# Patient Record
Sex: Female | Born: 1940 | Race: White | Hispanic: No | Marital: Married | Smoking: Former smoker
Health system: Southern US, Community
[De-identification: ages and names within clinical notes are randomized; demographics above are authoritative.]

## PROBLEM LIST (undated history)

## (undated) DIAGNOSIS — T8859XA Other complications of anesthesia, initial encounter: Secondary | ICD-10-CM

## (undated) DIAGNOSIS — F419 Anxiety disorder, unspecified: Secondary | ICD-10-CM

## (undated) DIAGNOSIS — C801 Malignant (primary) neoplasm, unspecified: Secondary | ICD-10-CM

## (undated) DIAGNOSIS — K219 Gastro-esophageal reflux disease without esophagitis: Secondary | ICD-10-CM

## (undated) DIAGNOSIS — Z9889 Other specified postprocedural states: Secondary | ICD-10-CM

## (undated) DIAGNOSIS — Z8719 Personal history of other diseases of the digestive system: Secondary | ICD-10-CM

## (undated) DIAGNOSIS — Z780 Asymptomatic menopausal state: Secondary | ICD-10-CM

## (undated) DIAGNOSIS — E785 Hyperlipidemia, unspecified: Secondary | ICD-10-CM

## (undated) DIAGNOSIS — M1990 Unspecified osteoarthritis, unspecified site: Secondary | ICD-10-CM

## (undated) DIAGNOSIS — N390 Urinary tract infection, site not specified: Secondary | ICD-10-CM

## (undated) DIAGNOSIS — M419 Scoliosis, unspecified: Secondary | ICD-10-CM

## (undated) DIAGNOSIS — H9190 Unspecified hearing loss, unspecified ear: Secondary | ICD-10-CM

## (undated) DIAGNOSIS — R55 Syncope and collapse: Secondary | ICD-10-CM

## (undated) DIAGNOSIS — M8580 Other specified disorders of bone density and structure, unspecified site: Secondary | ICD-10-CM

## (undated) DIAGNOSIS — R112 Nausea with vomiting, unspecified: Secondary | ICD-10-CM

## (undated) DIAGNOSIS — M549 Dorsalgia, unspecified: Secondary | ICD-10-CM

## (undated) DIAGNOSIS — G4730 Sleep apnea, unspecified: Secondary | ICD-10-CM

## (undated) DIAGNOSIS — G4700 Insomnia, unspecified: Secondary | ICD-10-CM

## (undated) DIAGNOSIS — T4145XA Adverse effect of unspecified anesthetic, initial encounter: Secondary | ICD-10-CM

## (undated) HISTORY — DX: Insomnia, unspecified: G47.00

## (undated) HISTORY — DX: Other specified disorders of bone density and structure, unspecified site: M85.80

## (undated) HISTORY — PX: HIATAL HERNIA REPAIR: SHX195

## (undated) HISTORY — PX: BLADDER SUSPENSION: SHX72

## (undated) HISTORY — DX: Syncope and collapse: R55

## (undated) HISTORY — PX: CHOLECYSTECTOMY: SHX55

## (undated) HISTORY — DX: Asymptomatic menopausal state: Z78.0

## (undated) HISTORY — DX: Dorsalgia, unspecified: M54.9

## (undated) HISTORY — PX: TONSILLECTOMY: SUR1361

## (undated) HISTORY — DX: Urinary tract infection, site not specified: N39.0

## (undated) HISTORY — PX: EYE SURGERY: SHX253

## (undated) HISTORY — DX: Unspecified osteoarthritis, unspecified site: M19.90

## (undated) HISTORY — PX: ABDOMINAL HYSTERECTOMY: SHX81

## (undated) HISTORY — DX: Hyperlipidemia, unspecified: E78.5

## (undated) HISTORY — DX: Scoliosis, unspecified: M41.9

---

## 1998-04-13 ENCOUNTER — Other Ambulatory Visit: Admission: RE | Admit: 1998-04-13 | Discharge: 1998-04-13 | Payer: Self-pay | Admitting: Obstetrics and Gynecology

## 2004-09-03 ENCOUNTER — Ambulatory Visit: Payer: Self-pay | Admitting: Internal Medicine

## 2004-09-17 ENCOUNTER — Ambulatory Visit: Payer: Self-pay | Admitting: Internal Medicine

## 2004-09-21 ENCOUNTER — Emergency Department: Payer: Self-pay | Admitting: Emergency Medicine

## 2004-09-21 ENCOUNTER — Other Ambulatory Visit: Payer: Self-pay

## 2004-11-15 ENCOUNTER — Ambulatory Visit: Payer: Self-pay | Admitting: Internal Medicine

## 2005-12-29 ENCOUNTER — Ambulatory Visit: Payer: Self-pay | Admitting: Internal Medicine

## 2006-03-23 ENCOUNTER — Inpatient Hospital Stay (HOSPITAL_COMMUNITY): Admission: RE | Admit: 2006-03-23 | Discharge: 2006-03-24 | Payer: Self-pay | Admitting: Obstetrics and Gynecology

## 2007-01-02 ENCOUNTER — Ambulatory Visit: Payer: Self-pay | Admitting: Internal Medicine

## 2007-11-22 ENCOUNTER — Ambulatory Visit: Payer: Self-pay | Admitting: Internal Medicine

## 2008-01-15 ENCOUNTER — Ambulatory Visit: Payer: Self-pay | Admitting: Internal Medicine

## 2009-01-16 ENCOUNTER — Ambulatory Visit: Payer: Self-pay | Admitting: Internal Medicine

## 2009-06-30 IMAGING — US ABDOMEN ULTRASOUND
1 series · 17 of 25 positions shown · non-contrast
Comparison: none

REASON FOR EXAM: Epigastric Pain
COMMENTS:

[Series 1: abdomen ultrasound · 17 of 71 slices shown]
[im 1/71]
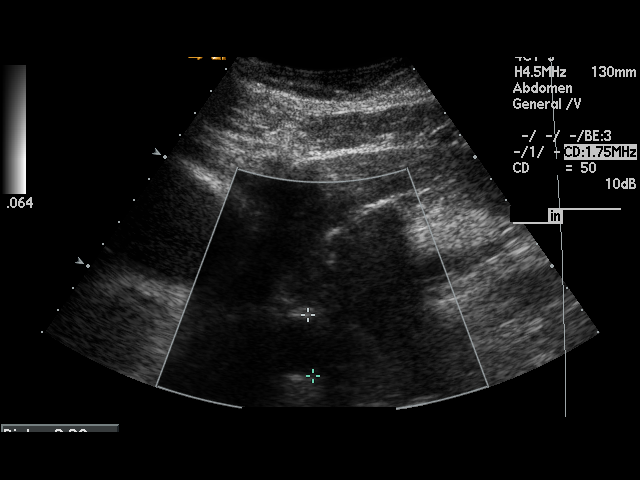
[im 6/71]
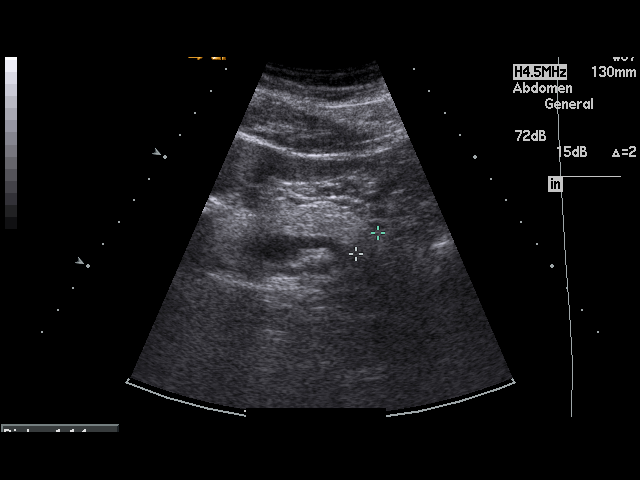
[im 9/71]
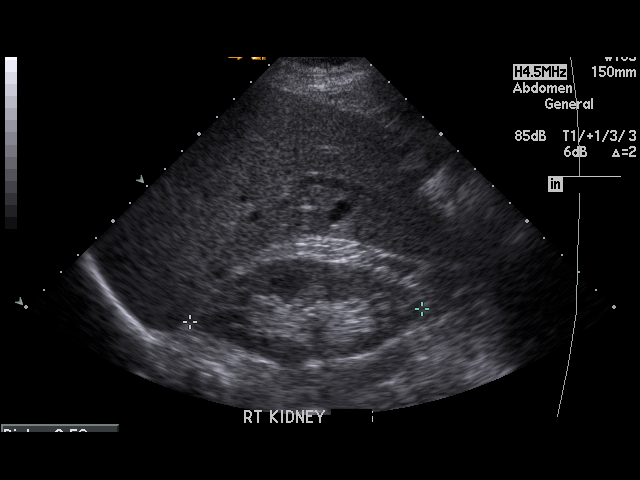
[im 15/71]
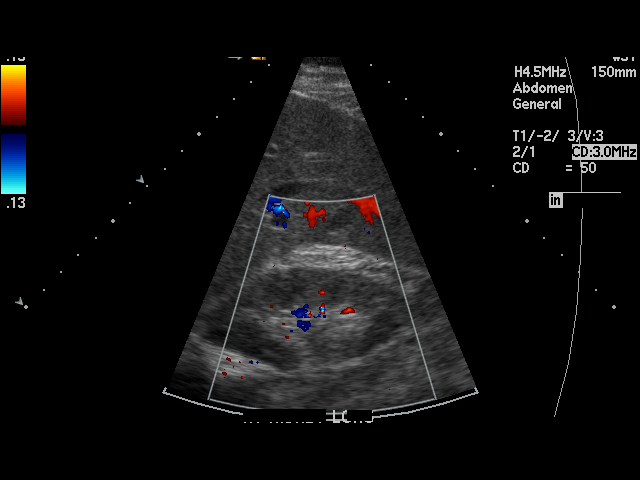
[im 18/71]
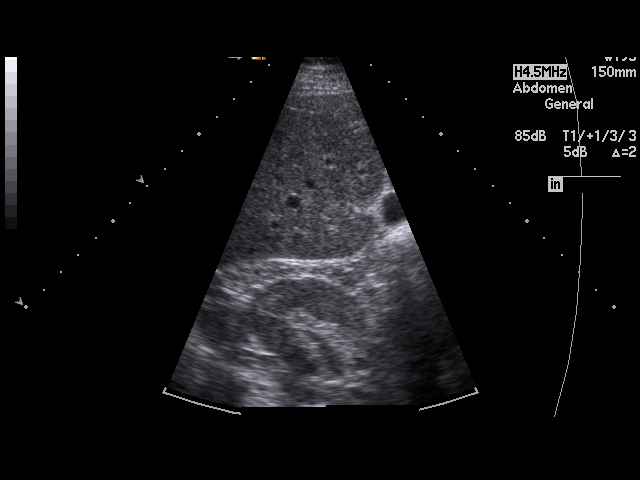
[im 24/71]
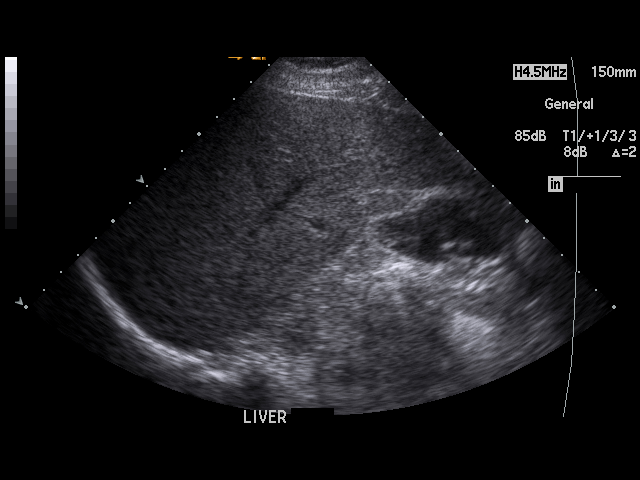
[im 27/71]
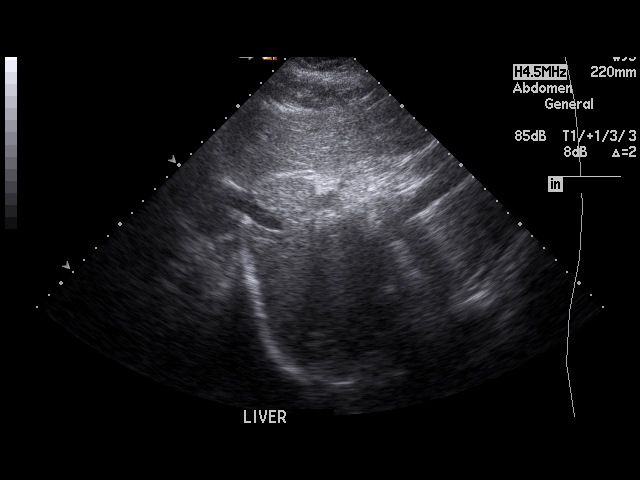
[im 33/71]
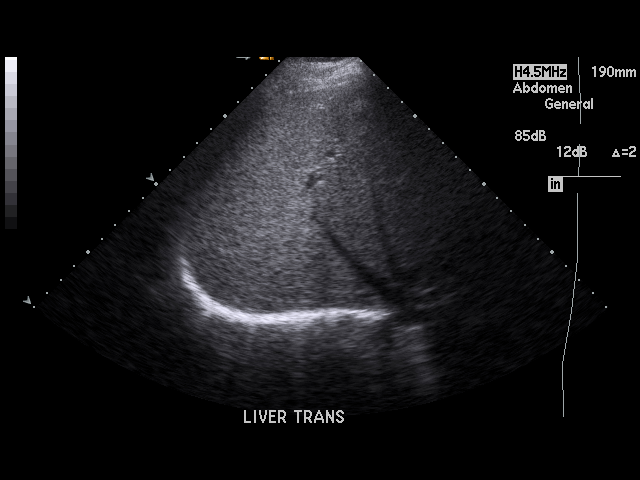
[im 36/71]
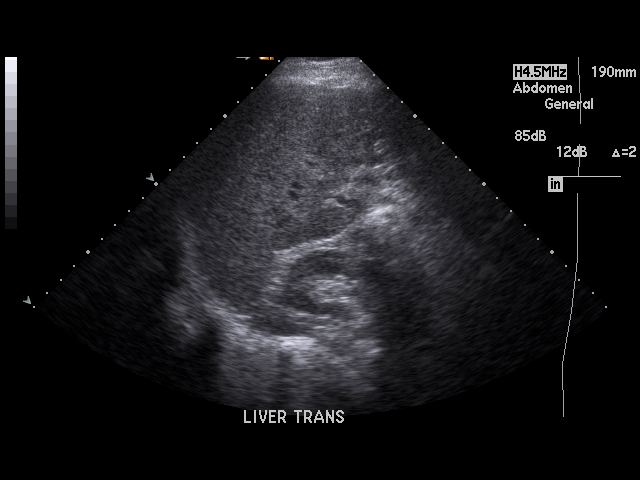
[im 38/71]
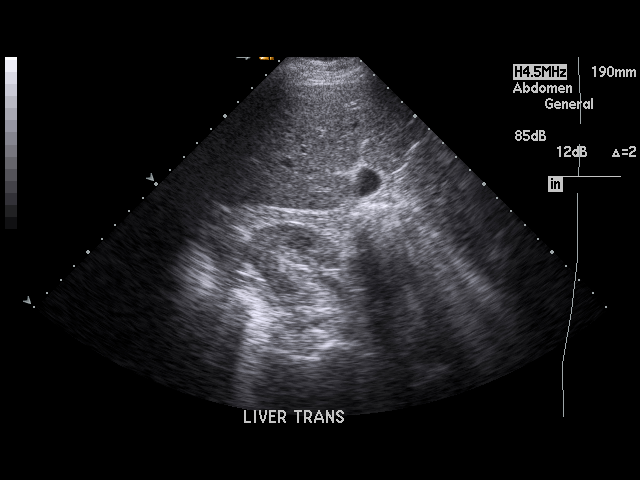
[im 44/71]
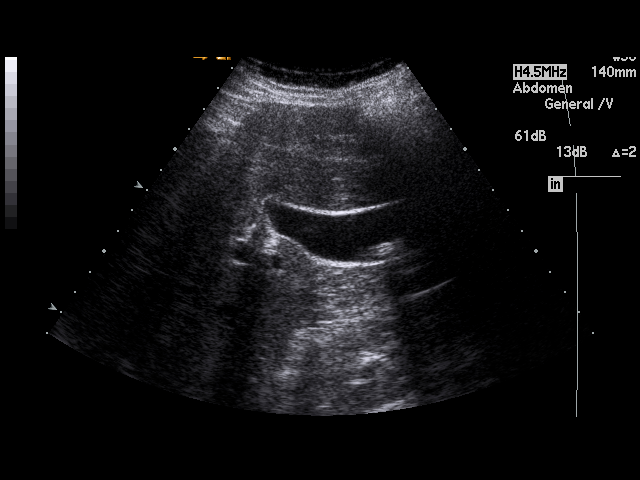
[im 47/71]
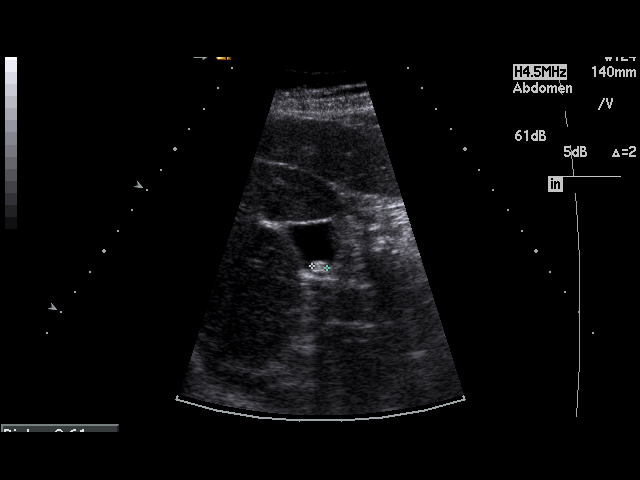
[im 53/71]
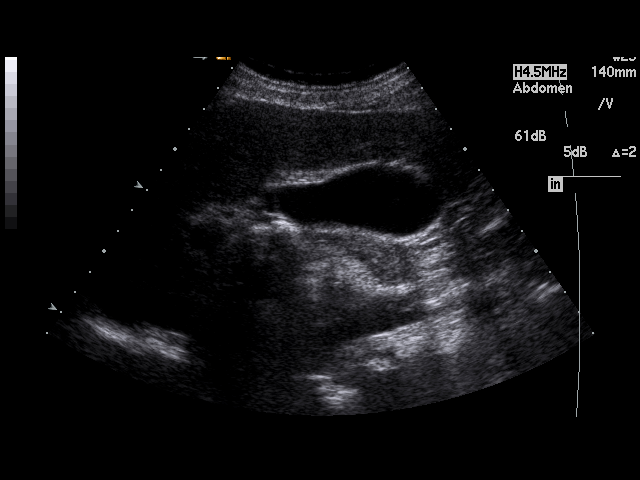
[im 56/71]
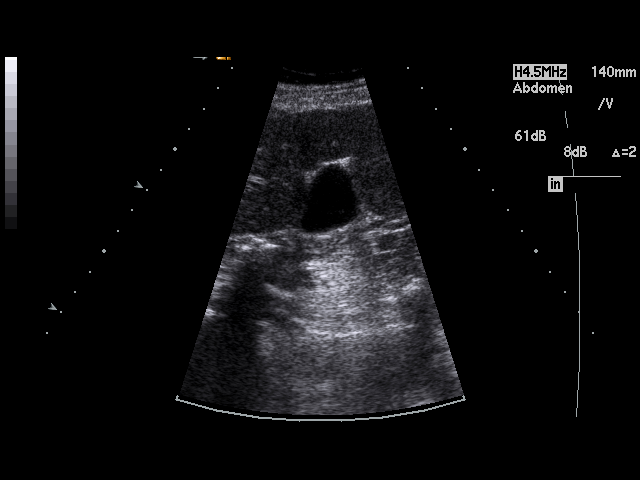
[im 62/71]
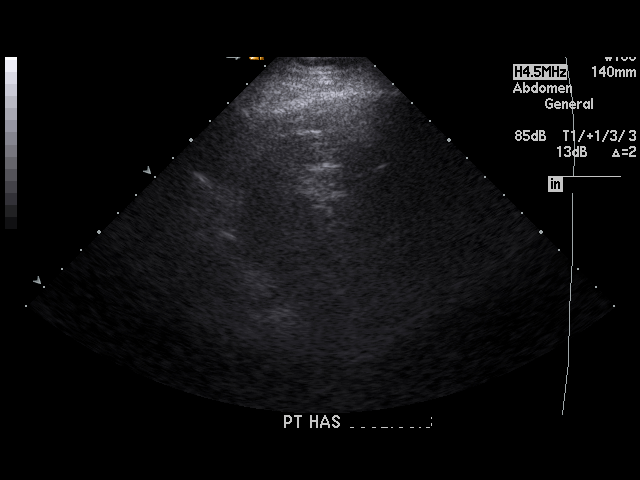
[im 65/71]
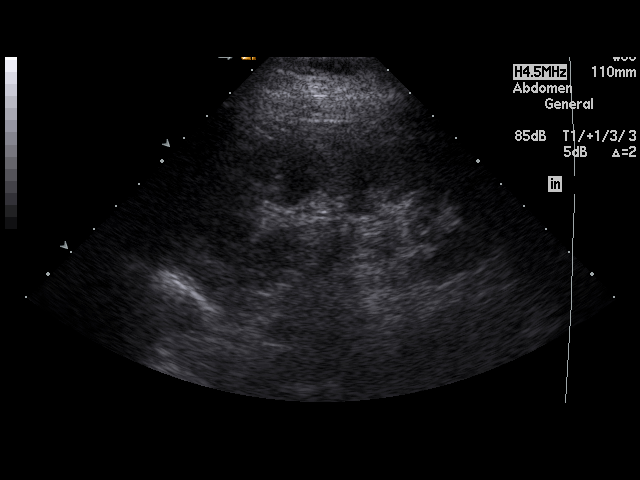
[im 71/71]
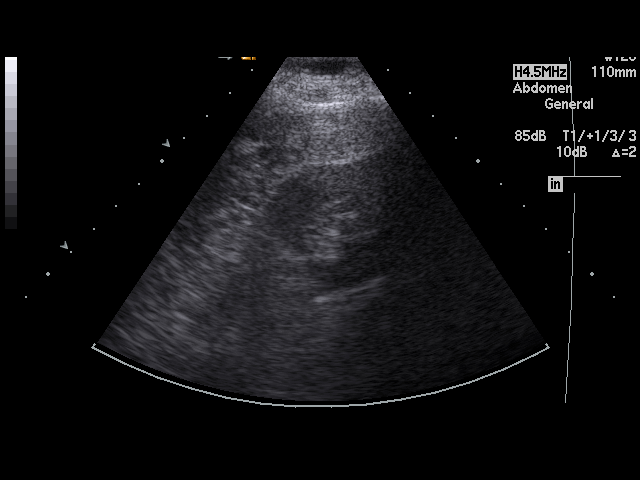

[17 of 25 positions shown; findings below may reference images not displayed]

PROCEDURE:     US  - US ABDOMEN GENERAL SURVEY  - November 22, 2007  [DATE]

RESULT:     There is a 1.23 cm cyst in the posterior aspect of the RIGHT
lobe of the liver. No other hepatic mass lesions are seen. The spleen is
within normal limits for size. The pancreas, abdominal aorta and inferior
vena cava show no significant abnormalities. There are multiple mobile
echodensities in the gallbladder consistent with gallstones. There is no
thickening of the gallbladder wall. The common bile duct measures 7.0 mm in
diameter. This is upper limits for normal in size or slightly enlarged. If
the patient has laboratory findings suggestive of common duct obstruction,
then further evaluation by biliary nuclide scan would be recommended. The
kidneys show no hydronephrosis. There is no ascites.
IMPRESSION: 1. Cholelithiasis.
2. The common bile duct is mildly prominent. The degree of prominence noted
can be normal for this age patient but the possibility of partial common
duct obstruction cannot be ruled out and therefore if there are laboratory
values suggesting common duct obstruction then further evaluation by biliary
nuclide scan would be recommended.
3. Incidental note is made of a small cyst to the RIGHT lobe of the liver.
4. No ascites is seen.

## 2009-11-13 ENCOUNTER — Ambulatory Visit: Payer: Self-pay | Admitting: Specialist

## 2010-01-26 ENCOUNTER — Ambulatory Visit: Payer: Self-pay | Admitting: Internal Medicine

## 2011-01-28 ENCOUNTER — Ambulatory Visit: Payer: Self-pay | Admitting: Internal Medicine

## 2011-09-06 ENCOUNTER — Ambulatory Visit: Payer: Self-pay | Admitting: Internal Medicine

## 2011-09-08 ENCOUNTER — Ambulatory Visit: Payer: Self-pay | Admitting: Internal Medicine

## 2011-09-09 ENCOUNTER — Ambulatory Visit: Payer: Self-pay | Admitting: Internal Medicine

## 2011-09-09 LAB — PROTIME-INR: INR: 1

## 2011-09-16 ENCOUNTER — Ambulatory Visit: Payer: Self-pay | Admitting: Internal Medicine

## 2011-09-20 ENCOUNTER — Ambulatory Visit: Payer: Self-pay | Admitting: Otolaryngology

## 2011-09-22 ENCOUNTER — Ambulatory Visit: Payer: Self-pay | Admitting: Otolaryngology

## 2011-09-26 LAB — WOUND CULTURE

## 2011-09-28 ENCOUNTER — Ambulatory Visit: Payer: Self-pay | Admitting: Internal Medicine

## 2011-09-28 LAB — CBC CANCER CENTER
Basophil %: 0.5 %
Comment - H1-Com1: NORMAL
Eosinophil %: 1.8 %
Eosinophil: 2 %
HCT: 40.1 % (ref 35.0–47.0)
Lymphocyte #: 1 x10 3/mm (ref 1.0–3.6)
Lymphocyte %: 16.6 %
Monocyte %: 7 %
Monocytes: 5 %
Neutrophil #: 4.6 x10 3/mm (ref 1.4–6.5)
Platelet: 243 x10 3/mm (ref 150–440)
RBC: 4.65 10*6/uL (ref 3.80–5.20)
RDW: 13.3 % (ref 11.5–14.5)
Segmented Neutrophils: 77 %
WBC: 6.2 x10 3/mm (ref 3.6–11.0)

## 2011-09-29 ENCOUNTER — Ambulatory Visit: Payer: Self-pay | Admitting: Internal Medicine

## 2011-10-13 LAB — CULTURE, FUNGUS WITHOUT SMEAR

## 2011-10-17 ENCOUNTER — Ambulatory Visit: Payer: Self-pay | Admitting: Internal Medicine

## 2011-11-04 LAB — PATHOLOGY REPORT

## 2011-12-09 DIAGNOSIS — C8590 Non-Hodgkin lymphoma, unspecified, unspecified site: Secondary | ICD-10-CM | POA: Insufficient documentation

## 2012-03-22 ENCOUNTER — Ambulatory Visit: Payer: Self-pay | Admitting: Internal Medicine

## 2012-06-04 DIAGNOSIS — N3020 Other chronic cystitis without hematuria: Secondary | ICD-10-CM | POA: Insufficient documentation

## 2013-03-25 ENCOUNTER — Ambulatory Visit: Payer: Self-pay | Admitting: Internal Medicine

## 2013-05-28 DIAGNOSIS — C8290 Follicular lymphoma, unspecified, unspecified site: Secondary | ICD-10-CM | POA: Insufficient documentation

## 2014-04-07 ENCOUNTER — Ambulatory Visit: Payer: Self-pay | Admitting: Internal Medicine

## 2014-06-16 DIAGNOSIS — K8020 Calculus of gallbladder without cholecystitis without obstruction: Secondary | ICD-10-CM | POA: Insufficient documentation

## 2014-06-24 ENCOUNTER — Ambulatory Visit: Payer: Self-pay | Admitting: Internal Medicine

## 2014-06-25 ENCOUNTER — Encounter: Payer: Self-pay | Admitting: *Deleted

## 2014-07-01 ENCOUNTER — Ambulatory Visit: Payer: Self-pay | Admitting: General Surgery

## 2014-07-03 ENCOUNTER — Ambulatory Visit: Payer: Self-pay | Admitting: Surgery

## 2014-11-08 NOTE — Op Note (Signed)
PATIENT NAME:  Natalie Torres, ALIG MR#:  [id number] DATE OF BIRTH:  31-Jul-1940  DATE OF PROCEDURE:  07/03/2014  PREOPERATIVE DIAGNOSIS:  Cholelithiasis and chronic cholecystitis.   POSTOPERATIVE DIAGNOSIS:  Cholelithiasis and chronic cholecystitis.   OPERATION: Robotic-assisted laparoscopic cholecystectomy.   SURGEON: Rodena Goldmann III, MD.    ANESTHESIA: General.   OPERATIVE PROCEDURE: With the patient in the supine position after induction of appropriate general anesthesia the patient's abdomen was prepped with ChloraPrep and draped with sterile towels.  The patient was placed in the head down, feet up position. A small infraumbilical incision was made in the standard fashion, carried down bluntly through subcutaneous tissue. A Veress needle was used to cannulate the peritoneal cavity. CO2 was insufflated to appropriate pressure measurements. With approximately 2 liters of CO2 were instilled the Veress needle was withdrawn and an 8.5 mm robotic port was inserted into the peritoneal cavity. Intraperitoneal position was confirmed and CO2 was re-insufflated. Two lateral ports were placed on the right side and a single one on the left side under direct vision. The patient was placed in the head up, feet down position and rotated slightly to the left side. Robot was then brought to the table, docked with the ports, the instruments inserted under direct vision. I then moved to the console. The liver, a slightly enlarged left lobe of the liver hung down over the gallbladder. The gallbladder was retracted superiorly and laterally, exposing the hepatoduodenal ligament. The cystic artery and the cystic duct were identified. The cystic artery was doubly clipped and divided first. The gallbladder was then a little easier to retract and cystic duct was visualized, cleaned, doubly clipped and divided. A small rent was made in the gallbladder, but only a small amount of bile was spilled and no stones were spilled. The  gallbladder was then dissected free from its bed with the hook and cautery apparatus. The area was copiously irrigated with a liter of warm saline solution under direct vision. Once the gallbladder was free it was grasped with the free assistant port and the robot undocked and removed from the patient. Midline port was exchanged for a 10 mm port and the EndoCatch apparatus inserted through the 10 mm port, capturing the gallbladder and removing it through the umbilical incision. The air was then desufflated. All ports withdrawn without difficulty. The midline fascia was closed with figure-of-eight sutures of 0 Vicryl. The skin was closed with 5-0 nylon. The area was infiltrated with 0.25% Marcaine for postoperative pain control. Sterile dressings were applied. The patient was returned to the recovery room, having tolerated the procedure well.  Sponge and instrument count were correct x 2 in the operating room.     ____________________________ Micheline Maze, MD rle:bu D: 07/03/2014 10:23:59 ET T: 07/03/2014 21:13:38 ET JOB#: [id number]  cc: Micheline Maze, MD, <Dictator> Leonie Douglas. Doy Hutching, MD Rodena Goldmann MD ELECTRONICALLY SIGNED 07/07/2014 19:51

## 2014-11-09 NOTE — Op Note (Signed)
PATIENT NAME:  TASHEMA, TILLER MR#:  [id number] DATE OF BIRTH:  [birth year]  DATE OF PROCEDURE:  09/22/2011  PREOPERATIVE DIAGNOSIS: Right supraclavicular lymphadenopathy.   POSTOPERATIVE DIAGNOSIS: Right supraclavicular lymphadenopathy.  PROCEDURE: Resection right deep supraclavicular lymph nodes.   SURGEON: Janalee Dane, MD   ASSISTANT: Mahlon Gammon, MD    DESCRIPTION OF PROCEDURE: The patient was placed in a supine position on the operating room table. After general endotracheal anesthesia had been induced, the patient was placed on a shoulder roll, neck extended and chin turned left. The planned incision was locally anesthetized, prepped and draped in the usual fashion. A 15 blade was used to make an incision through the skin and dissection was meticulously carried down to the large lymph node that was palpable in the right supraclavicular fossa. The lymph node was meticulously isolated and dissected from the surrounding soft tissues and sent for frozen section analysis. The frozen section showed significant necrosis without viable tissue. Therefore, after the wound had been closed, the pathologist recommended additional resection of cervical lymph nodes. Therefore, the wound was reopened and additional lymph nodes were taken from the supraclavicular fossa. The wound was then copiously irrigated and checked for meticulous hemostasis. Hemostasis was achieved and the wound was closed over a TLS drain. The patient was then returned to anesthesia, allowed to emerge from anesthesia in the operating room, and taken to the recovery room in stable condition. No complications. Estimated blood loss 10 mL.   ____________________________ J. Nadeen Landau, MD jmc:drc D: 09/22/2011 19:29:22 ET T: 09/23/2011 08:02:55 ET JOB#: [id number]  cc: Janalee Dane, MD, <Dictator> Nicholos Johns MD ELECTRONICALLY SIGNED 09/26/2011 8:08

## 2014-11-10 LAB — SURGICAL PATHOLOGY

## 2014-11-26 ENCOUNTER — Encounter
Admission: RE | Admit: 2014-11-26 | Discharge: 2014-11-26 | Disposition: A | Discharge status: Home / Self Care | Payer: Medicare Other | Source: Ambulatory Visit | Attending: Ophthalmology | Admitting: Ophthalmology

## 2014-11-26 DIAGNOSIS — Z01810 Encounter for preprocedural cardiovascular examination: Secondary | ICD-10-CM | POA: Diagnosis present

## 2014-11-26 DIAGNOSIS — H2511 Age-related nuclear cataract, right eye: Secondary | ICD-10-CM | POA: Diagnosis not present

## 2014-11-26 DIAGNOSIS — I10 Essential (primary) hypertension: Secondary | ICD-10-CM | POA: Diagnosis not present

## 2014-11-27 ENCOUNTER — Encounter: Payer: Self-pay | Admitting: *Deleted

## 2014-11-27 DIAGNOSIS — F419 Anxiety disorder, unspecified: Secondary | ICD-10-CM | POA: Diagnosis not present

## 2014-11-27 DIAGNOSIS — Z9049 Acquired absence of other specified parts of digestive tract: Secondary | ICD-10-CM | POA: Diagnosis not present

## 2014-11-27 DIAGNOSIS — M1990 Unspecified osteoarthritis, unspecified site: Secondary | ICD-10-CM | POA: Diagnosis not present

## 2014-11-27 DIAGNOSIS — Z885 Allergy status to narcotic agent status: Secondary | ICD-10-CM | POA: Diagnosis not present

## 2014-11-27 DIAGNOSIS — H9193 Unspecified hearing loss, bilateral: Secondary | ICD-10-CM | POA: Diagnosis not present

## 2014-11-27 DIAGNOSIS — G4730 Sleep apnea, unspecified: Secondary | ICD-10-CM | POA: Diagnosis not present

## 2014-11-27 DIAGNOSIS — Z8572 Personal history of non-Hodgkin lymphomas: Secondary | ICD-10-CM | POA: Diagnosis not present

## 2014-11-27 DIAGNOSIS — Z90710 Acquired absence of both cervix and uterus: Secondary | ICD-10-CM | POA: Diagnosis not present

## 2014-11-27 DIAGNOSIS — H2511 Age-related nuclear cataract, right eye: Secondary | ICD-10-CM | POA: Diagnosis present

## 2014-11-27 DIAGNOSIS — K5790 Diverticulosis of intestine, part unspecified, without perforation or abscess without bleeding: Secondary | ICD-10-CM | POA: Diagnosis not present

## 2014-11-27 DIAGNOSIS — Z79899 Other long term (current) drug therapy: Secondary | ICD-10-CM | POA: Diagnosis not present

## 2014-11-27 DIAGNOSIS — M810 Age-related osteoporosis without current pathological fracture: Secondary | ICD-10-CM | POA: Diagnosis not present

## 2014-11-27 DIAGNOSIS — E780 Pure hypercholesterolemia: Secondary | ICD-10-CM | POA: Diagnosis not present

## 2014-12-02 ENCOUNTER — Encounter: Payer: Self-pay | Admitting: *Deleted

## 2014-12-02 ENCOUNTER — Ambulatory Visit
Admission: RE | Admit: 2014-12-02 | Discharge: 2014-12-02 | Disposition: A | Discharge status: Home / Self Care | Payer: Medicare Other | Source: Ambulatory Visit | Attending: Ophthalmology | Admitting: Ophthalmology

## 2014-12-02 ENCOUNTER — Ambulatory Visit: Payer: Medicare Other | Admitting: Anesthesiology

## 2014-12-02 ENCOUNTER — Encounter
Admission: RE | Disposition: A | Discharge status: Home / Self Care | Payer: Self-pay | Source: Ambulatory Visit | Attending: Ophthalmology

## 2014-12-02 DIAGNOSIS — Z9049 Acquired absence of other specified parts of digestive tract: Secondary | ICD-10-CM | POA: Insufficient documentation

## 2014-12-02 DIAGNOSIS — F419 Anxiety disorder, unspecified: Secondary | ICD-10-CM | POA: Insufficient documentation

## 2014-12-02 DIAGNOSIS — M810 Age-related osteoporosis without current pathological fracture: Secondary | ICD-10-CM | POA: Insufficient documentation

## 2014-12-02 DIAGNOSIS — G4730 Sleep apnea, unspecified: Secondary | ICD-10-CM | POA: Insufficient documentation

## 2014-12-02 DIAGNOSIS — Z8572 Personal history of non-Hodgkin lymphomas: Secondary | ICD-10-CM | POA: Insufficient documentation

## 2014-12-02 DIAGNOSIS — M1990 Unspecified osteoarthritis, unspecified site: Secondary | ICD-10-CM | POA: Insufficient documentation

## 2014-12-02 DIAGNOSIS — Z885 Allergy status to narcotic agent status: Secondary | ICD-10-CM | POA: Insufficient documentation

## 2014-12-02 DIAGNOSIS — H2511 Age-related nuclear cataract, right eye: Secondary | ICD-10-CM | POA: Insufficient documentation

## 2014-12-02 DIAGNOSIS — H9193 Unspecified hearing loss, bilateral: Secondary | ICD-10-CM | POA: Insufficient documentation

## 2014-12-02 DIAGNOSIS — E780 Pure hypercholesterolemia: Secondary | ICD-10-CM | POA: Insufficient documentation

## 2014-12-02 DIAGNOSIS — Z90710 Acquired absence of both cervix and uterus: Secondary | ICD-10-CM | POA: Insufficient documentation

## 2014-12-02 DIAGNOSIS — Z79899 Other long term (current) drug therapy: Secondary | ICD-10-CM | POA: Insufficient documentation

## 2014-12-02 DIAGNOSIS — K5790 Diverticulosis of intestine, part unspecified, without perforation or abscess without bleeding: Secondary | ICD-10-CM | POA: Insufficient documentation

## 2014-12-02 HISTORY — DX: Unspecified hearing loss, unspecified ear: H91.90

## 2014-12-02 HISTORY — DX: Anxiety disorder, unspecified: F41.9

## 2014-12-02 HISTORY — DX: Unspecified osteoarthritis, unspecified site: M19.90

## 2014-12-02 HISTORY — DX: Adverse effect of unspecified anesthetic, initial encounter: T41.45XA

## 2014-12-02 HISTORY — DX: Sleep apnea, unspecified: G47.30

## 2014-12-02 HISTORY — DX: Malignant (primary) neoplasm, unspecified: C80.1

## 2014-12-02 HISTORY — DX: Other complications of anesthesia, initial encounter: T88.59XA

## 2014-12-02 HISTORY — PX: CATARACT EXTRACTION W/PHACO: SHX586

## 2014-12-02 SURGERY — PHACOEMULSIFICATION, CATARACT, WITH IOL INSERTION
Anesthesia: Monitor Anesthesia Care | Laterality: Right

## 2014-12-02 MED ORDER — EPINEPHRINE HCL 1 MG/ML IJ SOLN
INTRAOCULAR | Status: DC | PRN
  Administered 2014-12-02: 200 mL

## 2014-12-02 MED ORDER — MOXIFLOXACIN HCL 0.5 % OP SOLN - NO CHARGE
OPHTHALMIC | Status: DC | PRN
Start: 2014-12-02 — End: 2014-12-02
  Administered 2014-12-02: 1 [drp] via OPHTHALMIC

## 2014-12-02 MED ORDER — MIDAZOLAM HCL 2 MG/2ML IJ SOLN
INTRAMUSCULAR | Status: DC | PRN
  Administered 2014-12-02: 1 mg via INTRAVENOUS

## 2014-12-02 MED ORDER — POVIDONE-IODINE 5 % OP SOLN
1.0000 "application " | OPHTHALMIC | Status: AC | PRN
  Administered 2014-12-02: 1 via OPHTHALMIC

## 2014-12-02 MED ORDER — TETRACAINE HCL 0.5 % OP SOLN
OPHTHALMIC | Status: AC
  Administered 2014-12-02: 1 [drp] via OPHTHALMIC
  Filled 2014-12-02: qty 2

## 2014-12-02 MED ORDER — ARMC OPHTHALMIC DILATING GEL
1.0000 "application " | OPHTHALMIC | Status: AC | PRN
  Administered 2014-12-02 (×2): 1 via OPHTHALMIC

## 2014-12-02 MED ORDER — SODIUM CHLORIDE 0.9 % IV SOLN
INTRAVENOUS | Status: DC
  Administered 2014-12-02: 09:00:00 via INTRAVENOUS

## 2014-12-02 MED ORDER — NA CHONDROIT SULF-NA HYALURON 40-17 MG/ML IO SOLN
INTRAOCULAR | Status: AC
  Filled 2014-12-02: qty 1

## 2014-12-02 MED ORDER — CARBACHOL 0.01 % IO SOLN
INTRAOCULAR | Status: DC | PRN
Start: 2014-12-02 — End: 2014-12-02
  Administered 2014-12-02: 0.5 mL via INTRAOCULAR

## 2014-12-02 MED ORDER — EPINEPHRINE HCL 1 MG/ML IJ SOLN
INTRAMUSCULAR | Status: AC
  Filled 2014-12-02: qty 1

## 2014-12-02 MED ORDER — MOXIFLOXACIN HCL 0.5 % OP SOLN
OPHTHALMIC | Status: AC
  Filled 2014-12-02: qty 3

## 2014-12-02 MED ORDER — POVIDONE-IODINE 5 % OP SOLN
OPHTHALMIC | Status: AC
Start: 2014-12-02 — End: 2014-12-02
  Administered 2014-12-02: 1 via OPHTHALMIC
  Filled 2014-12-02: qty 30

## 2014-12-02 MED ORDER — TETRACAINE HCL 0.5 % OP SOLN
1.0000 [drp] | OPHTHALMIC | Status: AC | PRN
  Administered 2014-12-02: 1 [drp] via OPHTHALMIC

## 2014-12-02 MED ORDER — CEFUROXIME OPHTHALMIC INJECTION 1 MG/0.1 ML
INJECTION | OPHTHALMIC | Status: AC
  Filled 2014-12-02: qty 0.1

## 2014-12-02 MED ORDER — ARMC OPHTHALMIC DILATING GEL
OPHTHALMIC | Status: AC
  Filled 2014-12-02: qty 0.25

## 2014-12-02 SURGICAL SUPPLY — 24 items
ABBOTT ×1 IMPLANT
ACTIVE FMS ×1 IMPLANT
CANNULA ANT/CHMB 27G (MISCELLANEOUS) ×1 IMPLANT
CANNULA ANT/CHMB 27GA (MISCELLANEOUS) ×2 IMPLANT
GLOVE BIO SURGEON STRL SZ8 (GLOVE) ×2 IMPLANT
GLOVE BIOGEL M 6.5 STRL (GLOVE) ×2 IMPLANT
GLOVE SURG LX 8.0 MICRO (GLOVE) ×1
GLOVE SURG LX STRL 8.0 MICRO (GLOVE) ×1 IMPLANT
GOWN STRL REUS W/ TWL LRG LVL3 (GOWN DISPOSABLE) ×2 IMPLANT
GOWN STRL REUS W/TWL LRG LVL3 (GOWN DISPOSABLE) ×4
LENS IOL TECNIS 13.5 (Intraocular Lens) ×2 IMPLANT
LENS IOL TECNIS MONO 1P 13.5 (Intraocular Lens) IMPLANT
PACK CATARACT (MISCELLANEOUS) ×2 IMPLANT
PACK CATARACT BRASINGTON LX (MISCELLANEOUS) ×2 IMPLANT
PACK EYE AFTER SURG (MISCELLANEOUS) ×2 IMPLANT
SOL BSS BAG (MISCELLANEOUS) ×2
SOL PREP PVP 2OZ (MISCELLANEOUS) ×2
SOLUTION BSS BAG (MISCELLANEOUS) ×1 IMPLANT
SOLUTION PREP PVP 2OZ (MISCELLANEOUS) ×1 IMPLANT
SYR 5ML LL (SYRINGE) ×2 IMPLANT
SYR TB 1ML 27GX1/2 LL (SYRINGE) ×2 IMPLANT
WATER STERILE IRR 1000ML POUR (IV SOLUTION) ×2 IMPLANT
WIPE NON LINTING 3.25X3.25 (MISCELLANEOUS) ×2 IMPLANT
zcb0013.5 ×1 IMPLANT

## 2014-12-02 NOTE — Op Note (Signed)
PREOPERATIVE DIAGNOSIS:  Nuclear sclerotic cataract of the right eye.   POSTOPERATIVE DIAGNOSIS: same   OPERATIVE PROCEDURE:  Procedure(s): CATARACT EXTRACTION PHACO AND INTRAOCULAR LENS PLACEMENT (IOC)   SURGEON:  Birder Robson, MD.   ANESTHESIA: 1.      Managed anesthesia care. 2.      Topical tetracaine drops followed by 2% Xylocaine jelly applied in the preoperative holding area.   COMPLICATIONS:  None.   TECHNIQUE:   Stop and chop   DESCRIPTION OF PROCEDURE:  The patient was examined and consented in the preoperative holding area where the aforementioned topical anesthesia was applied to the right eye and then brought back to the Operating Room where the right eye was prepped and draped in the usual sterile ophthalmic fashion and a lid speculum was placed. A paracentesis was created with the side port blade and the anterior chamber was filled with viscoelastic. A near clear corneal incision was performed with the steel keratome. A continuous curvilinear capsulorrhexis was performed with a cystotome followed by the capsulorrhexis forceps. Hydrodissection and hydrodelineation were carried out with BSS on a blunt cannula. The lens was removed in a stop and chop  technique and the remaining cortical material was removed with the irrigation-aspiration handpiece. The capsular bag was inflated with viscoelastic and the Technis ZCB00  lens was placed in the capsular bag without complication. The remaining viscoelastic was removed from the eye with the irrigation-aspiration handpiece. The wounds were hydrated. The anterior chamber was flushed with Miostat and the eye was inflated to physiologic pressure. 0.1 mL of cefuroxime concentration 10 mg/mL was placed in the anterior chamber. The wounds were found to be water tight. The eye was dressed with Vigamox. The patient was given protective glasses to wear throughout the day and a shield with which to sleep tonight. The patient was also given drops  with which to begin a drop regimen today and will follow-up with me in one day.  Implant Name Type Inv. Item Serial No. Manufacturer Lot No. LRB No. Used  [id number]     [id number]     Right 1    Electronically signed: [place] 12/02/2014 9:32 AM

## 2014-12-02 NOTE — Transfer of Care (Signed)
Immediate Anesthesia Transfer of Care Note  Patient: Natalie Torres  Procedure(s) Performed: Procedure(s) with comments: CATARACT EXTRACTION PHACO AND INTRAOCULAR LENS PLACEMENT (IOC) (Right) - [place]  00:38 AP% 41.5 CDE 7.22  Patient Location: PACU  Anesthesia Type:MAC  Level of Consciousness: awake, alert , oriented and patient cooperative  Airway & Oxygen Therapy: Patient Spontanous Breathing  Post-op Assessment: Report given to RN, Post -op Vital signs reviewed and stable and Patient moving all extremities X 4  Post vital signs: Reviewed and stable  Last Vitals:  Filed Vitals:   12/02/14 0934  BP: 119/76  Pulse: 71  Temp: 36.7 C  Resp:     Complications: No apparent anesthesia complications

## 2014-12-02 NOTE — H&P (Signed)
  All labs reviewed. Abnormal studies sent to patients PCP when indicated.  Previous H&P reviewed, patient examined, there are NO CHANGES.  

## 2014-12-02 NOTE — Anesthesia Preprocedure Evaluation (Signed)
Anesthesia Evaluation  Patient identified by MRN, date of birth, ID band Patient awake    Reviewed: Allergy & Precautions, H&P , NPO status , Patient's Chart, lab work & pertinent test results, reviewed documented beta blocker date and time   History of Anesthesia Complications (+) PONV and history of anesthetic complications  Airway Mallampati: II  TM Distance: >3 FB Neck ROM: full    Dental no notable dental hx.    Pulmonary neg pulmonary ROS, sleep apnea ,  breath sounds clear to auscultation  Pulmonary exam normal       Cardiovascular Exercise Tolerance: Good negative cardio ROS  Rhythm:regular Rate:Normal     Neuro/Psych negative neurological ROS  negative psych ROS   GI/Hepatic negative GI ROS, Neg liver ROS,   Endo/Other  negative endocrine ROS  Renal/GU negative Renal ROS  negative genitourinary   Musculoskeletal   Abdominal   Peds  Hematology negative hematology ROS (+)   Anesthesia Other Findings   Reproductive/Obstetrics negative OB ROS                             Anesthesia Physical Anesthesia Plan  ASA: III  Anesthesia Plan: MAC   Post-op Pain Management:    Induction:   Airway Management Planned:   Additional Equipment:   Intra-op Plan:   Post-operative Plan:   Informed Consent: I have reviewed the patients History and Physical, chart, labs and discussed the procedure including the risks, benefits and alternatives for the proposed anesthesia with the patient or authorized representative who has indicated his/her understanding and acceptance.   Dental Advisory Given  Plan Discussed with: CRNA  Anesthesia Plan Comments:         Anesthesia Quick Evaluation

## 2014-12-02 NOTE — Anesthesia Postprocedure Evaluation (Signed)
  Anesthesia Post-op Note  Patient: Natalie Torres  Procedure(s) Performed: Procedure(s) with comments: CATARACT EXTRACTION PHACO AND INTRAOCULAR LENS PLACEMENT (IOC) (Right) - [place]  00:38 AP% 41.5 CDE 7.22  Anesthesia type:MAC  Patient location: PACU  Post pain: Pain level controlled  Post assessment: Post-op Vital signs reviewed, Patient's Cardiovascular Status Stable, Respiratory Function Stable, Patent Airway and No signs of Nausea or vomiting  Post vital signs: Reviewed and stable  Last Vitals:  Filed Vitals:   12/02/14 0934  BP: 119/76  Pulse: 71  Temp: 36.7 C  Resp:     Level of consciousness: awake, alert  and patient cooperative  Complications: No apparent anesthesia complications

## 2014-12-02 NOTE — Discharge Instructions (Addendum)
See cataract post op handout  Eye Surgery Discharge Instructions  Expect mild scratchy sensation or mild soreness. DO NOT RUB YOUR EYE!  The day of surgery:  Minimal physical activity, but bed rest is not required  No reading, computer work, or close hand work  No bending, lifting, or straining.  May watch TV  For 24 hours:  No driving, legal decisions, or alcoholic beverages  Safety precautions  Eat anything you prefer: It is better to start with liquids, then soup then solid foods.  _____ Eye patch should be worn until postoperative exam tomorrow.  ____ Solar shield eyeglasses should be worn for comfort in the sunlight/patch while sleeping  Resume all regular medications including aspirin or Coumadin if these were discontinued prior to surgery. You may shower, bathe, shave, or wash your hair. Tylenol may be taken for mild discomfort.  Call your doctor if you experience significant pain, nausea, or vomiting, fever > 101 or other signs of infection. [phone] or [phone] Specific instructions:  Follow-up Information    Follow up with Tim Lair, MD On 12/03/2014.   Specialty:  Ophthalmology   Why:  9:00   Contact information:   [address] [place] [address] [phone]

## 2014-12-03 ENCOUNTER — Encounter: Payer: Self-pay | Admitting: Ophthalmology

## 2014-12-12 ENCOUNTER — Encounter: Payer: Self-pay | Admitting: *Deleted

## 2014-12-12 DIAGNOSIS — F419 Anxiety disorder, unspecified: Secondary | ICD-10-CM | POA: Diagnosis not present

## 2014-12-12 DIAGNOSIS — C8590 Non-Hodgkin lymphoma, unspecified, unspecified site: Secondary | ICD-10-CM | POA: Diagnosis not present

## 2014-12-12 DIAGNOSIS — G4730 Sleep apnea, unspecified: Secondary | ICD-10-CM | POA: Diagnosis not present

## 2014-12-12 DIAGNOSIS — M810 Age-related osteoporosis without current pathological fracture: Secondary | ICD-10-CM | POA: Diagnosis not present

## 2014-12-12 DIAGNOSIS — Z9049 Acquired absence of other specified parts of digestive tract: Secondary | ICD-10-CM | POA: Diagnosis not present

## 2014-12-12 DIAGNOSIS — Z885 Allergy status to narcotic agent status: Secondary | ICD-10-CM | POA: Diagnosis not present

## 2014-12-12 DIAGNOSIS — Z9841 Cataract extraction status, right eye: Secondary | ICD-10-CM | POA: Diagnosis not present

## 2014-12-12 DIAGNOSIS — E780 Pure hypercholesterolemia: Secondary | ICD-10-CM | POA: Diagnosis not present

## 2014-12-12 DIAGNOSIS — H2512 Age-related nuclear cataract, left eye: Secondary | ICD-10-CM | POA: Diagnosis present

## 2014-12-16 ENCOUNTER — Ambulatory Visit: Payer: Medicare Other | Admitting: Anesthesiology

## 2014-12-16 ENCOUNTER — Encounter: Payer: Self-pay | Admitting: *Deleted

## 2014-12-16 ENCOUNTER — Encounter
Admission: RE | Disposition: A | Discharge status: Home / Self Care | Payer: Self-pay | Source: Ambulatory Visit | Attending: Ophthalmology

## 2014-12-16 ENCOUNTER — Ambulatory Visit
Admission: RE | Admit: 2014-12-16 | Discharge: 2014-12-16 | Disposition: A | Discharge status: Home / Self Care | Payer: Medicare Other | Source: Ambulatory Visit | Attending: Ophthalmology | Admitting: Ophthalmology

## 2014-12-16 DIAGNOSIS — F419 Anxiety disorder, unspecified: Secondary | ICD-10-CM | POA: Insufficient documentation

## 2014-12-16 DIAGNOSIS — H2512 Age-related nuclear cataract, left eye: Secondary | ICD-10-CM | POA: Diagnosis not present

## 2014-12-16 DIAGNOSIS — Z9049 Acquired absence of other specified parts of digestive tract: Secondary | ICD-10-CM | POA: Insufficient documentation

## 2014-12-16 DIAGNOSIS — Z885 Allergy status to narcotic agent status: Secondary | ICD-10-CM | POA: Insufficient documentation

## 2014-12-16 DIAGNOSIS — Z9841 Cataract extraction status, right eye: Secondary | ICD-10-CM | POA: Insufficient documentation

## 2014-12-16 DIAGNOSIS — E780 Pure hypercholesterolemia: Secondary | ICD-10-CM | POA: Insufficient documentation

## 2014-12-16 DIAGNOSIS — C8590 Non-Hodgkin lymphoma, unspecified, unspecified site: Secondary | ICD-10-CM | POA: Insufficient documentation

## 2014-12-16 DIAGNOSIS — M810 Age-related osteoporosis without current pathological fracture: Secondary | ICD-10-CM | POA: Insufficient documentation

## 2014-12-16 DIAGNOSIS — G4730 Sleep apnea, unspecified: Secondary | ICD-10-CM | POA: Insufficient documentation

## 2014-12-16 HISTORY — PX: CATARACT EXTRACTION W/PHACO: SHX586

## 2014-12-16 SURGERY — PHACOEMULSIFICATION, CATARACT, WITH IOL INSERTION
Anesthesia: Monitor Anesthesia Care | Site: Eye | Laterality: Left | Wound class: Clean

## 2014-12-16 MED ORDER — EPINEPHRINE HCL 1 MG/ML IJ SOLN
INTRAMUSCULAR | Status: AC
  Filled 2014-12-16: qty 1

## 2014-12-16 MED ORDER — ONDANSETRON HCL 4 MG/2ML IJ SOLN: 4.0000 mg | Freq: Once | INTRAMUSCULAR | Status: DC | PRN

## 2014-12-16 MED ORDER — FENTANYL CITRATE (PF) 100 MCG/2ML IJ SOLN: 25.0000 ug | INTRAMUSCULAR | Status: DC | PRN

## 2014-12-16 MED ORDER — MOXIFLOXACIN HCL 0.5 % OP SOLN - NO CHARGE
OPHTHALMIC | Status: DC | PRN
  Administered 2014-12-16: 1 [drp] via OPHTHALMIC

## 2014-12-16 MED ORDER — TETRACAINE HCL 0.5 % OP SOLN
1.0000 [drp] | OPHTHALMIC | Status: AC | PRN
  Administered 2014-12-16: 1 [drp] via OPHTHALMIC

## 2014-12-16 MED ORDER — NA CHONDROIT SULF-NA HYALURON 40-17 MG/ML IO SOLN
INTRAOCULAR | Status: AC
  Filled 2014-12-16: qty 1

## 2014-12-16 MED ORDER — TETRACAINE HCL 0.5 % OP SOLN
OPHTHALMIC | Status: AC
  Administered 2014-12-16: 1 [drp] via OPHTHALMIC
  Filled 2014-12-16: qty 2

## 2014-12-16 MED ORDER — CARBACHOL 0.01 % IO SOLN
INTRAOCULAR | Status: DC | PRN
  Administered 2014-12-16: 0.5 mL via INTRAOCULAR

## 2014-12-16 MED ORDER — EPINEPHRINE HCL 1 MG/ML IJ SOLN
INTRAMUSCULAR | Status: DC | PRN
  Administered 2014-12-16: 200 mL

## 2014-12-16 MED ORDER — MOXIFLOXACIN HCL 0.5 % OP SOLN
OPHTHALMIC | Status: AC
  Filled 2014-12-16: qty 3

## 2014-12-16 MED ORDER — ARMC OPHTHALMIC DILATING GEL
OPHTHALMIC | Status: AC
  Administered 2014-12-16: 1 via OPHTHALMIC
  Filled 2014-12-16: qty 0.25

## 2014-12-16 MED ORDER — CEFUROXIME OPHTHALMIC INJECTION 1 MG/0.1 ML
INJECTION | OPHTHALMIC | Status: AC
  Filled 2014-12-16: qty 0.1

## 2014-12-16 MED ORDER — POVIDONE-IODINE 5 % OP SOLN
1.0000 "application " | OPHTHALMIC | Status: AC | PRN
  Administered 2014-12-16: 1 via OPHTHALMIC

## 2014-12-16 MED ORDER — MOXIFLOXACIN HCL 0.5 % OP SOLN: 1.0000 [drp] | Freq: Once | OPHTHALMIC | Status: DC

## 2014-12-16 MED ORDER — ARMC OPHTHALMIC DILATING GEL
1.0000 "application " | OPHTHALMIC | Status: DC | PRN
  Administered 2014-12-16: 1 via OPHTHALMIC

## 2014-12-16 MED ORDER — POVIDONE-IODINE 5 % OP SOLN
OPHTHALMIC | Status: AC
  Administered 2014-12-16: 1 via OPHTHALMIC
  Filled 2014-12-16: qty 30

## 2014-12-16 MED ORDER — CEFUROXIME OPHTHALMIC INJECTION 1 MG/0.1 ML
INJECTION | OPHTHALMIC | Status: DC | PRN
  Administered 2014-12-16: 0.1 mL via INTRACAMERAL

## 2014-12-16 MED ORDER — SODIUM CHLORIDE 0.9 % IV SOLN
INTRAVENOUS | Status: DC
  Administered 2014-12-16: 08:00:00 via INTRAVENOUS

## 2014-12-16 MED ORDER — MIDAZOLAM HCL 2 MG/2ML IJ SOLN
INTRAMUSCULAR | Status: DC | PRN
  Administered 2014-12-16 (×2): 1 mg via INTRAVENOUS

## 2014-12-16 SURGICAL SUPPLY — 22 items
ACTIVE FMS ×2 IMPLANT
CANNULA ANT/CHMB 27G (MISCELLANEOUS) ×1 IMPLANT
CANNULA ANT/CHMB 27GA (MISCELLANEOUS) ×3 IMPLANT
GLOVE BIO SURGEON STRL SZ8 (GLOVE) ×3 IMPLANT
GLOVE BIOGEL M 6.5 STRL (GLOVE) ×3 IMPLANT
GLOVE SURG LX 8.0 MICRO (GLOVE) ×2
GLOVE SURG LX STRL 8.0 MICRO (GLOVE) ×1 IMPLANT
GOWN STRL REUS W/ TWL LRG LVL3 (GOWN DISPOSABLE) ×2 IMPLANT
GOWN STRL REUS W/TWL LRG LVL3 (GOWN DISPOSABLE) ×6
LENS IOL TECNIS 15.0 (Intraocular Lens) ×3 IMPLANT
LENS IOL TECNIS MONO 1P 15.0 (Intraocular Lens) IMPLANT
PACK CATARACT (MISCELLANEOUS) ×3 IMPLANT
PACK CATARACT BRASINGTON LX (MISCELLANEOUS) ×3 IMPLANT
PACK EYE AFTER SURG (MISCELLANEOUS) ×3 IMPLANT
SOL BSS BAG (MISCELLANEOUS) ×3
SOL PREP PVP 2OZ (MISCELLANEOUS) ×3
SOLUTION BSS BAG (MISCELLANEOUS) ×1 IMPLANT
SOLUTION PREP PVP 2OZ (MISCELLANEOUS) ×1 IMPLANT
SYR 5ML LL (SYRINGE) ×3 IMPLANT
SYR TB 1ML 27GX1/2 LL (SYRINGE) ×3 IMPLANT
WATER STERILE IRR 1000ML POUR (IV SOLUTION) ×3 IMPLANT
WIPE NON LINTING 3.25X3.25 (MISCELLANEOUS) ×3 IMPLANT

## 2014-12-16 NOTE — Discharge Instructions (Signed)
See cataract post op handout  Cataract Surgery Care After Refer to this sheet in the next few weeks. These instructions provide you with information on caring for yourself after your procedure. Your caregiver may also give you more specific instructions. Your treatment has been planned according to current medical practices, but problems sometimes occur. Call your caregiver if you have any problems or questions after your procedure.  HOME CARE INSTRUCTIONS   Avoid strenuous activities as directed by your caregiver.  Ask your caregiver when you can resume driving.  Use eyedrops or other medicines to help healing and control pressure inside your eye as directed by your caregiver.  Only take over-the-counter or prescription medicines for pain, discomfort, or fever as directed by your caregiver.  Do not to touch or rub your eyes.  You may be instructed to use a protective shield during the first few days and nights after surgery. If not, wear sunglasses to protect your eyes. This is to protect the eye from pressure or from being accidentally bumped.  Keep the area around your eye clean and dry. Avoid swimming or allowing water to hit you directly in the face while showering. Keep soap and shampoo out of your eyes.  Do not bend or lift heavy objects. Bending increases pressure in the eye. You can walk, climb stairs, and do light household chores.  Do not put a contact lens into the eye that had surgery until your caregiver says it is okay to do so.  Ask your doctor when you can return to work. This will depend on the kind of work that you do. If you work in a dusty environment, you may be advised to wear protective eyewear for a period of time.  Ask your caregiver when it will be safe to engage in sexual activity.  Continue with your regular eye exams as directed by your caregiver. What to expect:  It is normal to feel itching and mild discomfort for a few days after cataract surgery. Some  fluid discharge is also common, and your eye may be sensitive to light and touch.  After 1 to 2 days, even moderate discomfort should disappear. In most cases, healing will take about 6 weeks.  If you received an intraocular lens (IOL), you may notice that colors are very bright or have a blue tinge. Also, if you have been in bright sunlight, everything may appear reddish for a few hours. If you see these color tinges, it is because your lens is clear and no longer cloudy. Within a few months after receiving an IOL, these extra colors should go away. When you have healed, you will probably need new glasses. SEEK MEDICAL CARE IF:   You have increased bruising around your eye.  You have discomfort not helped by medicine. SEEK IMMEDIATE MEDICAL CARE IF:   You have a fever.  You have a worsening or sudden vision loss.  You have redness, swelling, or increasing pain in the eye.  You have a thick discharge from the eye that had surgery. MAKE SURE YOU:  Understand these instructions.  Will watch your condition.  Will get help right away if you are not doing well or get worse. Document Released: 01/21/2005 Document Revised: 09/26/2011 Document Reviewed: 02/25/2011 [facility] Patient Information 2015 [place], [place]. This information is not intended to replace advice given to you by your health care provider. Make sure you discuss any questions you have with your health care provider.

## 2014-12-16 NOTE — Anesthesia Postprocedure Evaluation (Signed)
  Anesthesia Post-op Note  Patient: Natalie Torres  Procedure(s) Performed: Procedure(s) with comments: CATARACT EXTRACTION PHACO AND INTRAOCULAR LENS PLACEMENT (IOC) (Left) - [place] 00:41 AP% 21.4 CDE 8.74  Anesthesia type:MAC  Patient location: Phase II  Post pain: Pain level controlled  Post assessment: Post-op Vital signs reviewed, Patient's Cardiovascular Status Stable, Respiratory Function Stable, Patent Airway and No signs of Nausea or vomiting  Post vital signs: Reviewed and stable  Last Vitals:  Filed Vitals:   12/16/14 0851  BP: 130/74  Pulse: 72  Temp: 36.7 C  Resp:     Level of consciousness: awake, alert  and patient cooperative  Complications: No apparent anesthesia complications

## 2014-12-16 NOTE — Op Note (Signed)
PREOPERATIVE DIAGNOSIS:  Nuclear sclerotic cataract of the left eye.   POSTOPERATIVE DIAGNOSIS:  same   OPERATIVE PROCEDURE:  Procedure(s): CATARACT EXTRACTION PHACO AND INTRAOCULAR LENS PLACEMENT (IOC)   SURGEON:  Birder Robson, MD.   ANESTHESIA:   Anesthesiologist: Alvin Critchley, MD CRNA: Rolla Plate, CRNA; Doreen Salvage, CRNA  1.      Managed anesthesia care. 2.      Topical tetracaine drops followed by 2% Xylocaine jelly applied in the preoperative holding area.   COMPLICATIONS:  None.   TECHNIQUE:   Stop and chop   DESCRIPTION OF PROCEDURE:  The patient was examined and consented in the preoperative holding area where the aforementioned topical anesthesia was applied to the left eye and then brought back to the Operating Room where the left eye was prepped and draped in the usual sterile ophthalmic fashion and a lid speculum was placed. A paracentesis was created with the side port blade and the anterior chamber was filled with viscoelastic. A near clear corneal incision was performed with the steel keratome. A continuous curvilinear capsulorrhexis was performed with a cystotome followed by the capsulorrhexis forceps. Hydrodissection and hydrodelineation were carried out with BSS on a blunt cannula. The lens was removed in a stop and chop  technique and the remaining cortical material was removed with the irrigation-aspiration handpiece. The capsular bag was inflated with viscoelastic and the Technis ZCB00 lens was placed in the capsular bag without complication. The remaining viscoelastic was removed from the eye with the irrigation-aspiration handpiece. The wounds were hydrated. The anterior chamber was flushed with Miostat and the eye was inflated to physiologic pressure. 0.1 mL of cefuroxime concentration 10 mg/mL was placed in the anterior chamber. The wounds were found to be water tight. The eye was dressed with Vigamox. The patient was given protective glasses to wear throughout  the day and a shield with which to sleep tonight. The patient was also given drops with which to begin a drop regimen today and will follow-up with me in one day.  Implant Name Type Inv. Item Serial No. Manufacturer Lot No. LRB No. Used  LENS IMPL INTRAOC ZCB00 15.0 - [id number] Intraocular Lens LENS IMPL INTRAOC ZCB00 15.0 [id number] AMO   Left 1   Procedure(s) with comments: CATARACT EXTRACTION PHACO AND INTRAOCULAR LENS PLACEMENT (IOC) (Left) - [place] 00:41 AP% 21.4 CDE 8.74  Electronically signed: [place] 12/16/2014 8:46 AM

## 2014-12-16 NOTE — Anesthesia Preprocedure Evaluation (Addendum)
Anesthesia Evaluation  Patient identified by MRN, date of birth, ID band Patient awake    Reviewed: Allergy & Precautions, NPO status , Patient's Chart, lab work & pertinent test results  Airway Mallampati: III  TM Distance: <3 FB Neck ROM: Full  Mouth opening: Limited Mouth Opening  Dental no notable dental hx.    Pulmonary sleep apnea ,    Pulmonary exam normal       Cardiovascular Normal cardiovascular exam Increased lipids   Neuro/Psych Anxiety    GI/Hepatic negative GI ROS, Neg liver ROS,   Endo/Other  negative endocrine ROS  Renal/GU negative Renal ROS  negative genitourinary   Musculoskeletal  (+) Arthritis -, Osteoarthritis,    Abdominal Normal abdominal exam  (+)   Peds  Hematology   Anesthesia Other Findings   Reproductive/Obstetrics                            Anesthesia Physical Anesthesia Plan  ASA: II  Anesthesia Plan: MAC   Post-op Pain Management:    Induction: Intravenous  Airway Management Planned: Nasal Cannula  Additional Equipment:   Intra-op Plan:   Post-operative Plan:   Informed Consent: I have reviewed the patients History and Physical, chart, labs and discussed the procedure including the risks, benefits and alternatives for the proposed anesthesia with the patient or authorized representative who has indicated his/her understanding and acceptance.   Dental advisory given  Plan Discussed with: CRNA and Surgeon  Anesthesia Plan Comments:         Anesthesia Quick Evaluation

## 2014-12-16 NOTE — Transfer of Care (Signed)
Immediate Anesthesia Transfer of Care Note  Patient: Natalie Torres  Procedure(s) Performed: Procedure(s) with comments: CATARACT EXTRACTION PHACO AND INTRAOCULAR LENS PLACEMENT (IOC) (Left) - [place] 00:41 AP% 21.4 CDE 8.74  Patient Location: PACU and Phase II  Anesthesia Type:MAC  Level of Consciousness: awake, alert  and oriented  Airway & Oxygen Therapy: Patient Spontanous Breathing  Post-op Assessment: Report given to RN  Post vital signs: Reviewed  Last Vitals:  Filed Vitals:   12/16/14 0851  BP: 130/74  Pulse: 72  Temp: 36.7 C  Resp:     Complications: No apparent anesthesia complications

## 2014-12-16 NOTE — H&P (Signed)
  All labs reviewed. Abnormal studies sent to patients PCP when indicated.  Previous H&P reviewed, patient examined, there are NO CHANGES.  Natalie Torres LOUIS5/31/20168:17 AM

## 2015-04-03 DIAGNOSIS — E041 Nontoxic single thyroid nodule: Secondary | ICD-10-CM | POA: Insufficient documentation

## 2015-04-03 DIAGNOSIS — E042 Nontoxic multinodular goiter: Secondary | ICD-10-CM | POA: Insufficient documentation

## 2015-04-08 DIAGNOSIS — Z87440 Personal history of urinary (tract) infections: Secondary | ICD-10-CM | POA: Insufficient documentation

## 2015-05-15 ENCOUNTER — Other Ambulatory Visit: Payer: Self-pay | Admitting: Internal Medicine

## 2015-05-15 DIAGNOSIS — Z1231 Encounter for screening mammogram for malignant neoplasm of breast: Secondary | ICD-10-CM

## 2015-05-18 ENCOUNTER — Other Ambulatory Visit: Payer: Self-pay | Admitting: Internal Medicine

## 2015-05-18 ENCOUNTER — Ambulatory Visit
Admission: RE | Admit: 2015-05-18 | Discharge: 2015-05-18 | Disposition: A | Discharge status: Home / Self Care | Payer: Medicare Other | Source: Ambulatory Visit | Attending: Internal Medicine | Admitting: Internal Medicine

## 2015-05-18 DIAGNOSIS — Z1231 Encounter for screening mammogram for malignant neoplasm of breast: Secondary | ICD-10-CM | POA: Diagnosis not present

## 2015-10-13 ENCOUNTER — Ambulatory Visit: Payer: Self-pay | Admitting: Urology

## 2015-10-16 ENCOUNTER — Encounter: Payer: Self-pay | Admitting: *Deleted

## 2015-10-20 ENCOUNTER — Encounter: Payer: Self-pay | Admitting: Urology

## 2015-10-20 ENCOUNTER — Ambulatory Visit
Admission: RE | Admit: 2015-10-20 | Discharge: 2015-10-20 | Disposition: A | Discharge status: Home / Self Care | Payer: Medicare Other | Source: Ambulatory Visit | Attending: Urology | Admitting: Urology

## 2015-10-20 ENCOUNTER — Ambulatory Visit (INDEPENDENT_AMBULATORY_CARE_PROVIDER_SITE_OTHER): Payer: Medicare Other | Admitting: Urology

## 2015-10-20 VITALS — BP 129/79 | HR 73 | Ht 63.0 in | Wt 141.9 lb

## 2015-10-20 DIAGNOSIS — M4186 Other forms of scoliosis, lumbar region: Secondary | ICD-10-CM | POA: Diagnosis not present

## 2015-10-20 DIAGNOSIS — N952 Postmenopausal atrophic vaginitis: Secondary | ICD-10-CM

## 2015-10-20 DIAGNOSIS — N200 Calculus of kidney: Secondary | ICD-10-CM | POA: Diagnosis not present

## 2015-10-20 DIAGNOSIS — N390 Urinary tract infection, site not specified: Secondary | ICD-10-CM

## 2015-10-20 DIAGNOSIS — R109 Unspecified abdominal pain: Secondary | ICD-10-CM | POA: Diagnosis not present

## 2015-10-20 LAB — URINALYSIS, COMPLETE
Bilirubin, UA: NEGATIVE
Glucose, UA: NEGATIVE
KETONES UA: NEGATIVE
NITRITE UA: NEGATIVE
Protein, UA: NEGATIVE
RBC, UA: NEGATIVE
SPEC GRAV UA: 1.01 (ref 1.005–1.030)
Urobilinogen, Ur: 0.2 mg/dL (ref 0.2–1.0)
pH, UA: 7 (ref 5.0–7.5)

## 2015-10-20 LAB — BLADDER SCAN AMB NON-IMAGING: Scan Result: 18

## 2015-10-20 LAB — MICROSCOPIC EXAMINATION: RBC MICROSCOPIC, UA: NONE SEEN /HPF (ref 0–?)

## 2015-10-20 NOTE — Progress Notes (Signed)
10/20/2015 11:37 AM   Natalie Torres [birth year] [id number]  Referring provider: Idelle Crouch, MD [place] [facility] [place], [place] [address]  Chief Complaint  Patient presents with  . Recurrent UTI    referred by Felipa Furnace    HPI: Patient is a 75 year old Caucasian female who is referred by her primary care physician, Dr. Doy Hutching for recurrent urinary tract infections.    Patient states that she has been experiencing 6 or more urinary tract infections a year.  Her symptoms of urinary tract infections consists of pain in the side on the left, straining to urinate, dysuria and nocturia 3-4.  She does not experience gross hematuria with her infections.  She does not have a history of nephrolithiasis, constipation, diarrhea or urinary leakage.  She has not noted a correlation with sexual activity with her infections. She does not take tub baths and engages in good perineal hygiene.  She does not experience fever, chills, nausea or vomiting with the infections.    When she is experiencing a UTI, she typically calls her PCP's office and they prescribed either Cipro or Monurol.    Her baseline urinary symptoms consist of nocturia 1, frequency, intermittency, hesitancy and a weak urinary stream.  She states she has seen Dr. Tresa Endo at [facility].  She states she underwent cystoscopy in 2014 which noted no abnormalities.  She has also had numerous scans due to her history of lymphoma.  She had a positive urine culture for Klebsiella in September 2015 and a positive urine culture for Escherichia coli in September 2016.  Renal ultrasound performed on 06/24/2014 noted no abnormalities within the kidneys.    Today, she feels that she does not have a urinary tract infection. Her UA is unremarkable. Her PVR is 18 mL.   PMH: Past Medical History  Diagnosis Date  . Sleep apnea   . Arthritis   . Anxiety   . HOH (hard of hearing)     aids  . Complication of  anesthesia     slow to wake  . Cancer ([place])     lymphoma  . HLD (hyperlipidemia)   . Insomnia   . Mechanical back pain   . Neurocardiogenic syncope   . Osteoarthritis   . Osteopenia   . Postmenopausal status   . Scoliosis   . Recurrent UTI   . Sleep apnea     Surgical History: Past Surgical History  Procedure Laterality Date  . Bladder suspension    . Cholecystectomy    . Abdominal hysterectomy    . Tonsillectomy    . Cataract extraction w/phaco Right 12/02/2014    Procedure: CATARACT EXTRACTION PHACO AND INTRAOCULAR LENS PLACEMENT (IOC);  Surgeon: Birder Robson, MD;  Location: [facility] ORS;  Service: Ophthalmology;  Laterality: Right;  [place]  00:38 AP% 41.5 CDE 7.22  . Eye surgery      CATARACT  . Cataract extraction w/phaco Left 12/16/2014    Procedure: CATARACT EXTRACTION PHACO AND INTRAOCULAR LENS PLACEMENT (IOC);  Surgeon: Birder Robson, MD;  Location: [facility] ORS;  Service: Ophthalmology;  Laterality: Left;  [place] 00:41 AP% 21.4 CDE 8.74    Home Medications:    Medication List       This list is accurate as of: 10/20/15 11:37 AM.  Always use your most recent med list.               acetaminophen 500 MG tablet  Commonly known as:  TYLENOL  Take  500 mg by mouth.     ciprofloxacin 500 MG tablet  Commonly known as:  CIPRO  Take 500 mg by mouth 2 (two) times daily.     D 5000 5000 units Tabs  Generic drug:  Cholecalciferol  Take by mouth.     ibuprofen 200 MG tablet  Commonly known as:  ADVIL,MOTRIN  Take 200 mg by mouth.     METAMUCIL 0.52 g capsule  Generic drug:  psyllium  Take by mouth.     mirtazapine 15 MG tablet  Commonly known as:  REMERON  Take 15 mg by mouth at bedtime.     rosuvastatin 5 MG tablet  Commonly known as:  CRESTOR  Take 5 mg by mouth at bedtime.     traMADol 50 MG tablet  Commonly known as:  ULTRAM  Take 25 mg by mouth 3 (three) times daily.     VAGIFEM 10 MCG Tabs vaginal tablet  Generic drug:  Estradiol  Place vaginally.          Allergies:  Allergies  Allergen Reactions  . Hydrocodone     Family History: Family History  Problem Relation Age of Onset  . Kidney disease Neg Hx   . Bladder Cancer Neg Hx     Social History:  reports that she has quit smoking. She does not have any smokeless tobacco history on file. She reports that she drinks alcohol. She reports that she does not use illicit drugs.  ROS: UROLOGY Frequent Urination?: Yes Hard to postpone urination?: Yes Burning/pain with urination?: No Get up at night to urinate?: Yes Leakage of urine?: No Urine stream starts and stops?: Yes Trouble starting stream?: Yes Do you have to strain to urinate?: Yes Blood in urine?: No Urinary tract infection?: Yes Sexually transmitted disease?: No Injury to kidneys or bladder?: No Painful intercourse?: No Weak stream?: No Currently pregnant?: Yes Vaginal bleeding?: No Last menstrual period?: n  Gastrointestinal Nausea?: No Vomiting?: No Indigestion/heartburn?: No Diarrhea?: No Constipation?: No  Constitutional Fever: No Night sweats?: No Weight loss?: No Fatigue?: No  Skin Skin rash/lesions?: No Itching?: No  Eyes Blurred vision?: No Double vision?: No  Ears/Nose/Throat Sore throat?: No Sinus problems?: No  Hematologic/Lymphatic Swollen glands?: No Easy bruising?: No  Cardiovascular Leg swelling?: No Chest pain?: No  Respiratory Cough?: No Shortness of breath?: Yes  Endocrine Excessive thirst?: No  Musculoskeletal Back pain?: Yes Joint pain?: Yes  Neurological Headaches?: No Dizziness?: No  Psychologic Depression?: No Anxiety?: No  Physical Exam: BP 129/79 mmHg  Pulse 73  Ht 5\' 3"  (1.6 m)  Wt 141 lb 14.4 oz (64.365 kg)  BMI 25.14 kg/m2  Constitutional: Well nourished. Alert and oriented, No acute distress. HEENT: [place] AT, moist mucus membranes. Trachea midline, no masses. Cardiovascular: No clubbing, cyanosis, or edema. Respiratory: Normal  respiratory effort, no increased work of breathing. GI: Abdomen is soft, non tender, non distended, no abdominal masses. Liver and spleen not palpable.  No hernias appreciated.  Stool sample for occult testing is not indicated.   GU: No CVA tenderness.  No bladder fullness or masses.  Atrophic external genitalia, normal pubic hair distribution, no lesions.  Normal urethral meatus, no lesions, no prolapse, no discharge.   No urethral masses, tenderness and/or tenderness. No bladder fullness, tenderness or masses. Normal vagina mucosa, good estrogen effect, no discharge, no lesions, good pelvic support, no cystocele or rectocele noted.  Cervix, uterus and adnexa are surgically absent.  Anus and perineum are without rashes or lesions.  Skin: No rashes, bruises or suspicious lesions. Lymph: No cervical or inguinal adenopathy. Neurologic: Grossly intact, no focal deficits, moving all 4 extremities. Psychiatric: Normal mood and affect.  Laboratory Data: Lab Results  Component Value Date   WBC 6.2 09/28/2011   HGB 13.5 09/28/2011   HCT 40.1 09/28/2011   MCV 86 09/28/2011   PLT 243 09/28/2011    Urinalysis Results for orders placed or performed in visit on 10/20/15  Microscopic Examination  Result Value Ref Range   WBC, UA 0-5 0 -  5 /hpf   RBC, UA None seen 0 -  2 /hpf   Epithelial Cells (non renal) 0-10 0 - 10 /hpf   Renal Epithel, UA 0-10 (A) None seen /hpf   Bacteria, UA Few None seen/Few  Urinalysis, Complete  Result Value Ref Range   Specific Gravity, UA 1.010 1.005 - 1.030   pH, UA 7.0 5.0 - 7.5   Color, UA Yellow Yellow   Appearance Ur Hazy (A) Clear   Leukocytes, UA Trace (A) Negative   Protein, UA Negative Negative/Trace   Glucose, UA Negative Negative   Ketones, UA Negative Negative   RBC, UA Negative Negative   Bilirubin, UA Negative Negative   Urobilinogen, Ur 0.2 0.2 - 1.0 mg/dL   Nitrite, UA Negative Negative   Microscopic Examination See below:   BLADDER SCAN AMB  NON-IMAGING  Result Value Ref Range   Scan Result 18    Pertinent Imaging: Results for NATESHA, PLACERES (MRN [id number]) as of 10/21/2015 21:25  Ref. Range 10/20/2015 11:14  Scan Result Unknown 18    Assessment & Plan:    1. Recurrent UTI:   Per patient report, she experiences more than 6 urinary tract infections a year.  I have 2 documented infections available to me at this appointment.  She is experiencing left-sided abdominal pain with her infections. I will obtain a KUB to evaluate for possible nephrolithiasis.  - Urinalysis, Complete - BLADDER SCAN AMB NON-IMAGING  2. Atrophic vaginitis:   Patient was given a sample of vaginal estrogen cream and instructed to apply 0.5mg  (pea-sized amount)  just inside the vaginal introitus with a finger-tip every night for two weeks and then Monday, Wednesday and Friday nights.  I explained to the patient that vaginally administered estrogen, which causes only a slight increase in the blood estrogen levels, have fewer contraindications and adverse systemic effects that oral HT.  She is currently on an oral HT,  but her dose is not high enough to stimulate vaginal mucus.  She will return in 2 weeks for a vaginal exam and symptom recheck.  3. Left-sided abdominal pain:   Patient will be having a KUB.     Return in about 2 weeks (around 11/03/2015) for exam.  These notes generated with voice recognition software. I apologize for typographical errors.  Natalie Torres, [facility] [address] [place], [place] [address] [phone]

## 2015-10-20 NOTE — Patient Instructions (Signed)
Apply 0.5mg  (pea-sized amount)  just inside the vaginal introitus with a finger-tip every night for two weeks and then Monday, Wednesday and Friday nights.  I explained to the patient that vaginally administered estrogen, which causes only a slight increase in the blood estrogen levels, have fewer contraindications and adverse systemic effects that oral HT.

## 2015-10-21 DIAGNOSIS — N390 Urinary tract infection, site not specified: Secondary | ICD-10-CM | POA: Insufficient documentation

## 2015-10-21 DIAGNOSIS — N952 Postmenopausal atrophic vaginitis: Secondary | ICD-10-CM | POA: Insufficient documentation

## 2015-10-21 DIAGNOSIS — R109 Unspecified abdominal pain: Secondary | ICD-10-CM | POA: Insufficient documentation

## 2015-11-05 ENCOUNTER — Ambulatory Visit: Payer: Medicare Other | Admitting: Urology

## 2015-11-10 ENCOUNTER — Ambulatory Visit (INDEPENDENT_AMBULATORY_CARE_PROVIDER_SITE_OTHER): Payer: Medicare Other | Admitting: Urology

## 2015-11-10 ENCOUNTER — Encounter: Payer: Self-pay | Admitting: Urology

## 2015-11-10 VITALS — BP 129/84 | HR 83 | Ht 63.0 in | Wt 143.0 lb

## 2015-11-10 DIAGNOSIS — N952 Postmenopausal atrophic vaginitis: Secondary | ICD-10-CM | POA: Diagnosis not present

## 2015-11-10 DIAGNOSIS — N390 Urinary tract infection, site not specified: Secondary | ICD-10-CM | POA: Diagnosis not present

## 2015-11-10 LAB — URINALYSIS, COMPLETE
Bilirubin, UA: NEGATIVE
Glucose, UA: NEGATIVE
Ketones, UA: NEGATIVE
Leukocytes, UA: NEGATIVE
Nitrite, UA: NEGATIVE
PH UA: 6.5 (ref 5.0–7.5)
Protein, UA: NEGATIVE
Specific Gravity, UA: 1.01 (ref 1.005–1.030)
Urobilinogen, Ur: 0.2 mg/dL (ref 0.2–1.0)

## 2015-11-10 LAB — MICROSCOPIC EXAMINATION: RBC, UA: NONE SEEN /hpf (ref 0–?)

## 2015-11-10 MED ORDER — ESTROGENS, CONJUGATED 0.625 MG/GM VA CREA: 1.0000 | TOPICAL_CREAM | Freq: Every day | VAGINAL | Status: DC

## 2015-11-10 MED ORDER — ESTRADIOL 0.1 MG/GM VA CREA: TOPICAL_CREAM | VAGINAL | Status: DC

## 2015-11-10 NOTE — Progress Notes (Signed)
12:33 PM   Natalie Torres 1940/08/15 [id number]  Referring provider: Idelle Crouch, MD [place] [facility] [place], [place] [address]  Chief Complaint  Patient presents with  . Follow-up    2wk    HPI: Patient is 35 Caucasian female who presents today for a 2 week follow-up after being placed on vaginal estrogen cream for atrophic vaginitis and recurrent urinary tract infections.  Background history Patient was referred by her primary care physician, Dr. Doy Hutching for recurrent urinary tract infections.   Patient stated that she has been experiencing 6 or more urinary tract infections a year.  Her symptoms of urinary tract infections consists of pain in the side on the left, straining to urinate, dysuria and nocturia 3-4.  She does not experience gross hematuria with her infections.  She does not have a history of nephrolithiasis, constipation, diarrhea or urinary leakage.  She has not noted a correlation with sexual activity with her infections. She does not take tub baths and engages in good perineal hygiene.  She does not experience fever, chills, nausea or vomiting with the infections.  KUB taken on 10/20/2015 noted no definite stones.  When she is experiencing a UTI, she typically calls her PCP's office and they prescribed either Cipro or Monurol.    Recurrent UTI's Her baseline urinary symptoms consist of nocturia 1, frequency, intermittency, hesitancy and a weak urinary stream.  She states she has seen Dr. Tresa Endo at [facility].  She states she underwent cystoscopy in 2014 which noted no abnormalities.  She has also had numerous scans due to her history of lymphoma.  She had a positive urine culture for Klebsiella in September 2015 and a positive urine culture for Escherichia coli in September 2016.  Renal ultrasound performed on 06/24/2014 noted no abnormalities within the kidneys.   Today, she feels that she may be developing a urinary tract infection  and would like a urinalysis performed.  She is not experiencing dysuria, gross hematuria or suprapubic pain this time.  She denies fevers, chills, nausea and vomiting.  She has noted a "twinge" when she urinates  Atrophic vaginitis Patient is on Vagifem vaginal tablets which she instills on a weekly basis, but she was found to have atrophic vaginitis on her exam 2 weeks ago.  She was initiated her on vaginal estrogen cream in an effort to help with the atrophy and prevent recurrent urinary tract infections.  She has noticed an improvement in the vaginal dryness. She has not had any vaginal burning or irritation with the cream. She did apply the cream nightly for 2 weeks and then she'll be moving onto a 3 nights weekly application.   PMH: Past Medical History  Diagnosis Date  . Sleep apnea   . Arthritis   . Anxiety   . HOH (hard of hearing)     aids  . Complication of anesthesia     slow to wake  . Cancer ([place])     lymphoma  . HLD (hyperlipidemia)   . Insomnia   . Mechanical back pain   . Neurocardiogenic syncope   . Osteoarthritis   . Osteopenia   . Postmenopausal status   . Scoliosis   . Recurrent UTI   . Sleep apnea     Surgical History: Past Surgical History  Procedure Laterality Date  . Bladder suspension    . Cholecystectomy    . Abdominal hysterectomy    . Tonsillectomy    .  Cataract extraction w/phaco Right 12/02/2014    Procedure: CATARACT EXTRACTION PHACO AND INTRAOCULAR LENS PLACEMENT (IOC);  Surgeon: Birder Robson, MD;  Location: [facility] ORS;  Service: Ophthalmology;  Laterality: Right;  [place]  00:38 AP% 41.5 CDE 7.22  . Eye surgery      CATARACT  . Cataract extraction w/phaco Left 12/16/2014    Procedure: CATARACT EXTRACTION PHACO AND INTRAOCULAR LENS PLACEMENT (IOC);  Surgeon: Birder Robson, MD;  Location: [facility] ORS;  Service: Ophthalmology;  Laterality: Left;  [place] 00:41 AP% 21.4 CDE 8.74    Home Medications:    Medication List       This list is  accurate as of: 11/10/15 12:33 PM.  Always use your most recent med list.               acetaminophen 500 MG tablet  Commonly known as:  TYLENOL  Take 500 mg by mouth.     conjugated estrogens vaginal cream  Commonly known as:  PREMARIN  Place 1 Applicatorful vaginally daily. Apply 0.5mg  (pea-sized amount)  just inside the vaginal introitus with a finger-tip every night for two weeks and then Monday, Wednesday and Friday nights.     D 5000 5000 units Tabs  Generic drug:  Cholecalciferol  Take by mouth.     ibuprofen 200 MG tablet  Commonly known as:  ADVIL,MOTRIN  Take 200 mg by mouth.     METAMUCIL 0.52 g capsule  Generic drug:  psyllium  Take by mouth.     mirtazapine 15 MG tablet  Commonly known as:  REMERON  Take 15 mg by mouth at bedtime.     rosuvastatin 5 MG tablet  Commonly known as:  CRESTOR  Take 5 mg by mouth at bedtime.     traMADol 50 MG tablet  Commonly known as:  ULTRAM  Take 25 mg by mouth 3 (three) times daily.     VAGIFEM 10 MCG Tabs vaginal tablet  Generic drug:  Estradiol  Place vaginally.     estradiol 0.1 MG/GM vaginal cream  Commonly known as:  ESTRACE VAGINAL  Apply 0.5mg  (pea-sized amount)  just inside the vaginal introitus with a finger-tip every night for two weeks and then Monday, Wednesday and Friday nights.        Allergies:  Allergies  Allergen Reactions  . Hydrocodone     Family History: Family History  Problem Relation Age of Onset  . Kidney disease Neg Hx   . Bladder Cancer Neg Hx     Social History:  reports that she has quit smoking. She does not have any smokeless tobacco history on file. She reports that she drinks alcohol. She reports that she does not use illicit drugs.  ROS: UROLOGY Frequent Urination?: Yes Hard to postpone urination?: No Burning/pain with urination?: No Get up at night to urinate?: Yes Leakage of urine?: No Urine stream starts and stops?: No Trouble starting stream?: No Do you have to  strain to urinate?: No Blood in urine?: No Urinary tract infection?: No Sexually transmitted disease?: No Injury to kidneys or bladder?: No Painful intercourse?: No Weak stream?: No Currently pregnant?: No Vaginal bleeding?: No Last menstrual period?: n  Gastrointestinal Nausea?: No Vomiting?: No Indigestion/heartburn?: No Diarrhea?: No Constipation?: No  Constitutional Fever: No Night sweats?: No Weight loss?: No Fatigue?: No  Skin Skin rash/lesions?: No Itching?: No  Eyes Blurred vision?: No Double vision?: No  Ears/Nose/Throat Sore throat?: No Sinus problems?: No  Hematologic/Lymphatic Swollen glands?: No Easy bruising?: No  Cardiovascular  Leg swelling?: No Chest pain?: No  Respiratory Cough?: No Shortness of breath?: No  Endocrine Excessive thirst?: No  Musculoskeletal Back pain?: No Joint pain?: No  Neurological Headaches?: No Dizziness?: No  Psychologic Depression?: No Anxiety?: No  Physical Exam: BP 129/84 mmHg  Pulse 83  Ht 5\' 3"  (1.6 m)  Wt 143 lb (64.864 kg)  BMI 25.34 kg/m2  Constitutional: Well nourished. Alert and oriented, No acute distress. HEENT: [place] AT, moist mucus membranes. Trachea midline, no masses. Cardiovascular: No clubbing, cyanosis, or edema. Respiratory: Normal respiratory effort, no increased work of breathing. GI: Abdomen is soft, non tender, non distended, no abdominal masses. Liver and spleen not palpable.  No hernias appreciated.  Stool sample for occult testing is not indicated.   GU: No CVA tenderness.  No bladder fullness or masses.  Atrophic external genitalia, normal pubic hair distribution, no lesions. Bilateral sebaceous cysts are noted in the labia.  Normal urethral meatus, no lesions, no prolapse, no discharge.   No urethral masses, tenderness and/or tenderness. No bladder fullness, tenderness or masses. Normal vagina mucosa, good estrogen effect, no discharge, no lesions, good pelvic support, no  cystocele or rectocele noted.  Cervix, uterus and adnexa are surgically absent.  Anus and perineum are without rashes or lesions.   Skin: No rashes, bruises or suspicious lesions. Lymph: No cervical or inguinal adenopathy. Neurologic: Grossly intact, no focal deficits, moving all 4 extremities. Psychiatric: Normal mood and affect.  Laboratory Data: Lab Results  Component Value Date   WBC 6.2 09/28/2011   HGB 13.5 09/28/2011   HCT 40.1 09/28/2011   MCV 86 09/28/2011   PLT 243 09/28/2011    Urinalysis Results for orders placed or performed in visit on 11/10/15  CULTURE, URINE COMPREHENSIVE  Result Value Ref Range   Urine Culture, Comprehensive Final report (A)    Result 1 Escherichia coli (A)    ANTIMICROBIAL SUSCEPTIBILITY Comment   Microscopic Examination  Result Value Ref Range   WBC, UA 0-5 0 -  5 /hpf   RBC, UA None seen 0 -  2 /hpf   Epithelial Cells (non renal) >10 (A) 0 - 10 /hpf   Bacteria, UA Moderate (A) None seen/Few  Urinalysis, Complete  Result Value Ref Range   Specific Gravity, UA 1.010 1.005 - 1.030   pH, UA 6.5 5.0 - 7.5   Color, UA Yellow Yellow   Appearance Ur Hazy (A) Clear   Leukocytes, UA Negative Negative   Protein, UA Negative Negative/Trace   Glucose, UA Negative Negative   Ketones, UA Negative Negative   RBC, UA Trace (A) Negative   Bilirubin, UA Negative Negative   Urobilinogen, Ur 0.2 0.2 - 1.0 mg/dL   Nitrite, UA Negative Negative   Microscopic Examination See below:     Pertinent Imaging: Results for ARIANNY, PRISBREY (MRN [id number]) as of 10/21/2015 21:25  Ref. Range 10/20/2015 11:14  Scan Result Unknown 18    Assessment & Plan:    1. Recurrent UTI:   Per patient report, she experiences more than 6 urinary tract infections a year.   She does admit that she has Cipro and Monurol at home that she uses when she starts to feel an infection coming on.  Encouraged her to first let us examine a UA and obtaining a culture before starting  antibiotics and she is agreeable.  She does feel that she is having an infection today and would like a UA conducted.  UA demonstrated leukocytes and many bacteria. I will  send it for culture.  We'll wait to prescribe an antibiotic once urine culture and sensitivity results are available.  - Urinalysis, Complete   2. Atrophic vaginitis:   Patient will now apply the vaginal estrogen cream 3 nights weekly. She will follow-up in 3 months for exam and symptom recheck.  Return in about 3 months (around 02/09/2016) for exam and symptom recheck.  These notes generated with voice recognition software. I apologize for typographical errors.  Zara Council, [facility] [address] [place], [place] [address] [phone]

## 2015-11-12 LAB — CULTURE, URINE COMPREHENSIVE

## 2015-11-16 ENCOUNTER — Telehealth: Payer: Self-pay

## 2015-11-16 DIAGNOSIS — N390 Urinary tract infection, site not specified: Secondary | ICD-10-CM

## 2015-11-16 MED ORDER — AMOXICILLIN-POT CLAVULANATE 875-125 MG PO TABS: 1.0000 | ORAL_TABLET | Freq: Two times a day (BID) | ORAL | Status: AC

## 2015-11-16 NOTE — Telephone Encounter (Signed)
Spoke with pt in reference to +ucx. Made aware abx has been sent to pharmacy. Pt voiced understanding.  

## 2015-11-16 NOTE — Telephone Encounter (Signed)
-----   Message from Nori Riis, PA-C sent at 11/12/2015  8:50 PM EDT ----- Patient has a positive urine culture.  Please have the patient start Augmentin 875/125 mg, one tablet twice daily for 7 days. I would then like a repeat UA and urine culture 3-5 days after she completes the antibiotic.

## 2015-11-25 ENCOUNTER — Other Ambulatory Visit: Payer: Self-pay

## 2015-11-25 DIAGNOSIS — N390 Urinary tract infection, site not specified: Secondary | ICD-10-CM

## 2015-11-26 ENCOUNTER — Other Ambulatory Visit: Payer: Medicare Other

## 2015-11-26 DIAGNOSIS — N390 Urinary tract infection, site not specified: Secondary | ICD-10-CM

## 2015-11-26 LAB — URINALYSIS, COMPLETE
BILIRUBIN UA: NEGATIVE
GLUCOSE, UA: NEGATIVE
Ketones, UA: NEGATIVE
Leukocytes, UA: NEGATIVE
Nitrite, UA: POSITIVE — AB
PROTEIN UA: NEGATIVE
RBC UA: NEGATIVE
Specific Gravity, UA: 1.015 (ref 1.005–1.030)
UUROB: 0.2 mg/dL (ref 0.2–1.0)
pH, UA: 5.5 (ref 5.0–7.5)

## 2015-11-26 LAB — MICROSCOPIC EXAMINATION: RBC, UA: NONE SEEN /hpf (ref 0–?)

## 2015-11-28 LAB — CULTURE, URINE COMPREHENSIVE

## 2015-11-30 ENCOUNTER — Telehealth: Payer: Self-pay

## 2015-11-30 DIAGNOSIS — N390 Urinary tract infection, site not specified: Secondary | ICD-10-CM

## 2015-11-30 MED ORDER — NITROFURANTOIN MONOHYD MACRO 100 MG PO CAPS: 100.0000 mg | ORAL_CAPSULE | Freq: Two times a day (BID) | ORAL | Status: AC

## 2015-11-30 NOTE — Telephone Encounter (Signed)
Spoke with pt in reference to +ucx. Made aware abx has been sent to pharmacy. Pt voiced understanding. Pt will RTC for cath specimen on 12/10/15 at 9. Orders placed.

## 2015-11-30 NOTE — Telephone Encounter (Signed)
-----   Message from Nori Riis, PA-C sent at 11/28/2015  5:45 PM EDT ----- Patient has a positive urine culture.  She needs to start Macrobid 100 mg 1 capsule twice daily for 7 days.  I would then like to have a CATH specimen 3 to 5 days after completing the antibiotic for UA and culture.

## 2015-12-10 ENCOUNTER — Ambulatory Visit (INDEPENDENT_AMBULATORY_CARE_PROVIDER_SITE_OTHER): Payer: Medicare Other

## 2015-12-10 DIAGNOSIS — N390 Urinary tract infection, site not specified: Secondary | ICD-10-CM

## 2015-12-10 LAB — URINALYSIS, COMPLETE
Bilirubin, UA: NEGATIVE
Glucose, UA: NEGATIVE
KETONES UA: NEGATIVE
LEUKOCYTES UA: NEGATIVE
Nitrite, UA: NEGATIVE
Protein, UA: NEGATIVE
RBC, UA: NEGATIVE
SPEC GRAV UA: 1.015 (ref 1.005–1.030)
Urobilinogen, Ur: 0.2 mg/dL (ref 0.2–1.0)
pH, UA: 5.5 (ref 5.0–7.5)

## 2015-12-10 LAB — MICROSCOPIC EXAMINATION
Bacteria, UA: NONE SEEN
Epithelial Cells (non renal): NONE SEEN /hpf (ref 0–10)
RBC, UA: NONE SEEN /hpf (ref 0–?)

## 2015-12-10 NOTE — Progress Notes (Signed)
In and Out Catheterization  Patient is present today for a I & O catheterization due to recurrent UTI. Patient was cleaned and prepped in a sterile fashion with betadine and Lidocaine 2% jelly was instilled into the urethra.  A 14FR cath was inserted no complications were noted , 180ml of urine return was noted, urine was clear and yellow in color. A clean urine sample was collected for u/a and cx. Bladder was drained  And catheter was removed with out difficulty.    Preformed by: Toniann Fail, LPN

## 2015-12-13 LAB — CULTURE, URINE COMPREHENSIVE

## 2015-12-15 ENCOUNTER — Telehealth: Payer: Self-pay

## 2015-12-15 NOTE — Telephone Encounter (Signed)
-----   Message from Nori Riis, PA-C sent at 12/14/2015  8:22 PM EDT ----- Patient only has 5,000 colonies.  This is not an UTI, but a contaminate.  No antibiotic is needed at this time.

## 2015-12-15 NOTE — Telephone Encounter (Signed)
She should have an office visit.

## 2015-12-15 NOTE — Telephone Encounter (Signed)
Spoke with pt in reference to UTI symptoms. Pt stated that she is starting to feel better this afternoon and would like to wait to have another office visit. Reinforced with pt to call our office if she feels as though she needs to come in. Pt voiced understanding of whole conversation.

## 2015-12-15 NOTE — Telephone Encounter (Signed)
Spoke with pt in reference to not needing an abx at this time. Pt stated that she is still having lower abd and dysuria. Please advise.

## 2016-02-16 ENCOUNTER — Ambulatory Visit (INDEPENDENT_AMBULATORY_CARE_PROVIDER_SITE_OTHER): Payer: Medicare Other | Admitting: Urology

## 2016-02-16 ENCOUNTER — Encounter: Payer: Self-pay | Admitting: Urology

## 2016-02-16 VITALS — BP 168/79 | HR 83 | Ht 63.0 in | Wt 143.1 lb

## 2016-02-16 DIAGNOSIS — R109 Unspecified abdominal pain: Secondary | ICD-10-CM

## 2016-02-16 DIAGNOSIS — N390 Urinary tract infection, site not specified: Secondary | ICD-10-CM | POA: Diagnosis not present

## 2016-02-16 NOTE — Progress Notes (Signed)
10:09 AM   Natalie Torres 1941/05/21 [id number]  Referring provider: Idelle Crouch, MD [facility] [place], [place] [address]  Chief Complaint  Patient presents with  . Vaginal Atrophy    3 month follow up    HPI: Patient is 42 Caucasian female who presents today for a 3 month follow-up after being placed on vaginal estrogen cream for atrophic vaginitis and recurrent urinary tract infections.  Background history Patient was referred by her primary care physician, Dr. Doy Hutching for recurrent urinary tract infections.   Patient stated that she has been experiencing 6 or more urinary tract infections a year.  Her symptoms of urinary tract infections consists of pain in the side on the left, straining to urinate, dysuria and nocturia 3-4.  She does not experience gross hematuria with her infections.  She does not have a history of nephrolithiasis, constipation, diarrhea or urinary leakage.  She has not noted a correlation with sexual activity with her infections. She does not take tub baths and engages in good perineal hygiene.  She does not experience fever, chills, nausea or vomiting with the infections.  KUB taken on 10/20/2015 noted no definite stones.  When she is experiencing a UTI, she typically calls her PCP's office and they prescribed either Cipro or Monurol.    Recurrent UTI's Her baseline urinary symptoms consist of nocturia 1, frequency, intermittency, hesitancy and a weak urinary stream.  She states she has seen Dr. Tresa Endo at [facility].  She states she underwent cystoscopy in 2014 which noted no abnormalities.  She has also had numerous scans due to her history of lymphoma.  She had a positive urine culture for Klebsiella in September 2015 and a positive urine culture for Escherichia coli in September 2016.  Renal ultrasound performed on 06/24/2014 noted no abnormalities within the kidneys.   Her urine culture was positive for E. Coli from her  last visit.  Repeat culture results were insignificant.    Atrophic vaginitis Patient is on Vagifem vaginal tablets which she instills on a weekly basis, but she was found to have atrophic vaginitis on her exam 2 weeks ago.  She was initiated her on vaginal estrogen cream in an effort to help with the atrophy and prevent recurrent urinary tract infections.  She has noticed an improvement in the vaginal dryness. She has not had any vaginal burning or irritation with the cream. She did apply the cream nightly for 2 weeks and then she'll be moving onto a 3 nights weekly application.   PMH: Past Medical History:  Diagnosis Date  . Anxiety   . Arthritis   . Cancer ([place])    lymphoma  . Complication of anesthesia    slow to wake  . HLD (hyperlipidemia)   . HOH (hard of hearing)    aids  . Insomnia   . Mechanical back pain   . Neurocardiogenic syncope   . Osteoarthritis   . Osteopenia   . Postmenopausal status   . Recurrent UTI   . Scoliosis   . Sleep apnea   . Sleep apnea     Surgical History: Past Surgical History:  Procedure Laterality Date  . ABDOMINAL HYSTERECTOMY    . BLADDER SUSPENSION    . CATARACT EXTRACTION W/PHACO Right 12/02/2014   Procedure: CATARACT EXTRACTION PHACO AND INTRAOCULAR LENS PLACEMENT (IOC);  Surgeon: Birder Robson, MD;  Location: [facility] ORS;  Service: Ophthalmology;  Laterality: Right;  [place]  00:38 AP% 41.5  CDE 7.22  . CATARACT EXTRACTION W/PHACO Left 12/16/2014   Procedure: CATARACT EXTRACTION PHACO AND INTRAOCULAR LENS PLACEMENT (IOC);  Surgeon: Birder Robson, MD;  Location: [facility] ORS;  Service: Ophthalmology;  Laterality: Left;  [place] 00:41 AP% 21.4 CDE 8.74  . CHOLECYSTECTOMY    . EYE SURGERY     CATARACT  . TONSILLECTOMY      Home Medications:    Medication List       Accurate as of 02/16/16 10:09 AM. Always use your most recent med list.          acetaminophen 500 MG tablet Commonly known as:  TYLENOL Take 500 mg by mouth.   aspirin  325 MG tablet Take 325-650 mg by mouth.   conjugated estrogens vaginal cream Commonly known as:  PREMARIN Place 1 Applicatorful vaginally daily. Apply 0.5mg  (pea-sized amount)  just inside the vaginal introitus with a finger-tip every night for two weeks and then Monday, Wednesday and Friday nights.   D 5000 5000 units Tabs Generic drug:  Cholecalciferol Take by mouth.   fosfomycin 3 g Pack Commonly known as:  MONUROL TAKE ONE PACKET EVERY 10 DAYS FOR PREVENTION OF UTI   ibuprofen 200 MG tablet Commonly known as:  ADVIL,MOTRIN Take 200 mg by mouth.   METAMUCIL 0.52 g capsule Generic drug:  psyllium Take by mouth.   mirtazapine 15 MG tablet Commonly known as:  REMERON Take 15 mg by mouth at bedtime.   rosuvastatin 5 MG tablet Commonly known as:  CRESTOR Take 5 mg by mouth at bedtime.   traMADol 50 MG tablet Commonly known as:  ULTRAM Take 25 mg by mouth 3 (three) times daily.   VAGIFEM 10 MCG Tabs vaginal tablet Generic drug:  Estradiol Place vaginally.   estradiol 0.1 MG/GM vaginal cream Commonly known as:  ESTRACE VAGINAL Apply 0.5mg  (pea-sized amount)  just inside the vaginal introitus with a finger-tip every night for two weeks and then Monday, Wednesday and Friday nights.       Allergies:  Allergies  Allergen Reactions  . Hydrocodone     Family History: Family History  Problem Relation Age of Onset  . Kidney disease Neg Hx   . Bladder Cancer Neg Hx     Social History:  reports that she has quit smoking. She has never used smokeless tobacco. She reports that she drinks alcohol. She reports that she does not use drugs.  ROS: UROLOGY Frequent Urination?: No Hard to postpone urination?: No Burning/pain with urination?: No Get up at night to urinate?: No Leakage of urine?: No Urine stream starts and stops?: No Trouble starting stream?: No Do you have to strain to urinate?: No Blood in urine?: No Urinary tract infection?: No Sexually transmitted  disease?: No Injury to kidneys or bladder?: No Painful intercourse?: No Weak stream?: No Currently pregnant?: No Vaginal bleeding?: No Last menstrual period?: n  Gastrointestinal Nausea?: No Vomiting?: No Indigestion/heartburn?: No Diarrhea?: No Constipation?: No  Constitutional Fever: No Night sweats?: No Weight loss?: No Fatigue?: No  Skin Skin rash/lesions?: No Itching?: No  Eyes Blurred vision?: No Double vision?: No  Ears/Nose/Throat Sore throat?: No Sinus problems?: No  Hematologic/Lymphatic Swollen glands?: No Easy bruising?: No  Cardiovascular Leg swelling?: No Chest pain?: No  Respiratory Cough?: No Shortness of breath?: No  Endocrine Excessive thirst?: No  Musculoskeletal Back pain?: No Joint pain?: No  Neurological Headaches?: No Dizziness?: No  Psychologic Depression?: No Anxiety?: No  Physical Exam: BP (!) 168/79   Pulse 83  Ht 5\' 3"  (1.6 m)   Wt 143 lb 1.6 oz (64.9 kg)   BMI 25.35 kg/m   Constitutional: Well nourished. Alert and oriented, No acute distress. HEENT: [place] AT, moist mucus membranes. Trachea midline, no masses. Cardiovascular: No clubbing, cyanosis, or edema. Respiratory: Normal respiratory effort, no increased work of breathing. GI: Abdomen is soft, non tender, non distended, no abdominal masses. Liver and spleen not palpable.  No hernias appreciated.  Stool sample for occult testing is not indicated.   GU: No CVA tenderness.  No bladder fullness or masses.  Atrophic external genitalia, normal pubic hair distribution, no lesions. Bilateral sebaceous cysts are noted in the labia.  Normal urethral meatus, no lesions, no prolapse, no discharge.   No urethral masses, tenderness and/or tenderness. No bladder fullness, tenderness or masses. Normal vagina mucosa, good estrogen effect, no discharge, no lesions, good pelvic support, no cystocele or rectocele noted.  Cervix, uterus and adnexa are surgically absent.  Anus and  perineum are without rashes or lesions.   Skin: No rashes, bruises or suspicious lesions. Lymph: No cervical or inguinal adenopathy. Neurologic: Grossly intact, no focal deficits, moving all 4 extremities. Psychiatric: Normal mood and affect.  Laboratory Data: Lab Results  Component Value Date   WBC 6.2 09/28/2011   HGB 13.5 09/28/2011   HCT 40.1 09/28/2011   MCV 86 09/28/2011   PLT 243 09/28/2011    Assessment & Plan:    1. Recurrent UTI:   Per patient report, she experiences more than 6 urinary tract infections a year.   She does admit that she has Cipro and Monurol at home that she uses when she starts to feel an infection coming on.  Reinforced to first let us examine a UA and obtaining a culture before starting antibiotics and she is agreeable.  She is going out of town and would like a UA checked, but I advised her that cathing her could cause an infection.  Since she is not having symptoms of an UTI, I advised against it.   She is agreeable.    2. Atrophic vaginitis:   Patient will continue applying the vaginal estrogen cream 3 nights weekly. She will follow-up in 12 months for exam and symptom recheck.  Return in about 1 year (around 02/15/2017) for exam and prn for UTI symptoms.  These notes generated with voice recognition software. I apologize for typographical errors.  Zara Council, [facility] [address], [place], [place] [address] [phone]

## 2016-03-03 ENCOUNTER — Telehealth: Payer: Self-pay | Admitting: Urology

## 2016-03-03 ENCOUNTER — Ambulatory Visit: Payer: Medicare Other

## 2016-03-03 DIAGNOSIS — N390 Urinary tract infection, site not specified: Secondary | ICD-10-CM

## 2016-03-03 LAB — URINALYSIS, COMPLETE
Bilirubin, UA: NEGATIVE
GLUCOSE, UA: NEGATIVE
KETONES UA: NEGATIVE
LEUKOCYTES UA: NEGATIVE
Nitrite, UA: NEGATIVE
Protein, UA: NEGATIVE
RBC, UA: NEGATIVE
SPEC GRAV UA: 1.01 (ref 1.005–1.030)
Urobilinogen, Ur: 0.2 mg/dL (ref 0.2–1.0)
pH, UA: 6 (ref 5.0–7.5)

## 2016-03-03 LAB — MICROSCOPIC EXAMINATION
Bacteria, UA: NONE SEEN
Epithelial Cells (non renal): >10 /hpf — AB (ref 0–10)

## 2016-03-03 NOTE — Telephone Encounter (Signed)
Spoke with pt in reference to UTI like symptoms. Pt wanted to come for a specimen. Pt was added to nurse schedule.

## 2016-03-03 NOTE — Progress Notes (Signed)
Pt gave a clean catch specimen for u/a and cx. Pt c/o dysuria, lower abd pain, pressure, increased frequency.

## 2016-03-06 LAB — CULTURE, URINE COMPREHENSIVE

## 2016-03-07 ENCOUNTER — Telehealth: Payer: Self-pay

## 2016-03-07 NOTE — Telephone Encounter (Signed)
Spoke with pt in reference to -ucx. Pt voiced understanding.  

## 2016-03-07 NOTE — Telephone Encounter (Signed)
-----   Message from Nori Riis, PA-C sent at 03/06/2016  5:08 PM EDT ----- Please notify the patient that her urine culture is negative for infection.

## 2016-03-09 DIAGNOSIS — R61 Generalized hyperhidrosis: Secondary | ICD-10-CM | POA: Insufficient documentation

## 2016-05-03 ENCOUNTER — Other Ambulatory Visit: Payer: Self-pay | Admitting: Internal Medicine

## 2016-05-03 DIAGNOSIS — Z1231 Encounter for screening mammogram for malignant neoplasm of breast: Secondary | ICD-10-CM

## 2016-06-01 ENCOUNTER — Ambulatory Visit
Admission: RE | Admit: 2016-06-01 | Discharge: 2016-06-01 | Disposition: A | Discharge status: Home / Self Care | Payer: Medicare Other | Source: Ambulatory Visit | Attending: Internal Medicine | Admitting: Internal Medicine

## 2016-06-01 DIAGNOSIS — Z1231 Encounter for screening mammogram for malignant neoplasm of breast: Secondary | ICD-10-CM | POA: Diagnosis not present

## 2016-08-29 ENCOUNTER — Ambulatory Visit: Payer: Medicare Other | Admitting: Urology

## 2016-08-29 ENCOUNTER — Encounter: Payer: Self-pay | Admitting: Urology

## 2016-08-29 VITALS — BP 151/94 | HR 76 | Ht 63.0 in | Wt 144.8 lb

## 2016-08-29 DIAGNOSIS — N390 Urinary tract infection, site not specified: Secondary | ICD-10-CM | POA: Diagnosis not present

## 2016-08-29 DIAGNOSIS — N952 Postmenopausal atrophic vaginitis: Secondary | ICD-10-CM

## 2016-08-29 MED ORDER — TRIMETHOPRIM 100 MG PO TABS: 100.0000 mg | ORAL_TABLET | Freq: Every day | ORAL | 0 refills | Status: DC

## 2016-08-29 NOTE — Progress Notes (Signed)
3:56 PM   Natalie Torres 01-12-1941 [id number]  Referring provider: Idelle Crouch, MD [facility] [place],  [address]  Chief Complaint  Patient presents with  . Follow-up    Dysuria last seen 02/2016    HPI: Patient is 44 Caucasian female who presents today for persistent dysuria and suprapubic pain after a course of Cipro by her PCP, Dr. Doy Hutching.    Recurrent UTI's Patient has a history of 5 documented UTI's over the last three years.  Risk factors for recurrent UTI's are age and vaginal atrophy. referred by her primary care physician, Dr. Doy Hutching for recurrent urinary tract infections.   She states she has seen Dr. Tresa Endo at [facility].  She states she underwent cystoscopy in 2014 which noted no abnormalities.  She has also had numerous scans due to her history of lymphoma.  Patient recently experienced another UTI with Serratia marcescens for which she was prescribed Cipro through her PCP's office.  Today, she is experiencing urgency 3, frequency 7, engages in toilet mapping, incontinence 3 and nocturia 3. She is not having dysuria or suprapubic pain at this time as they have resolved a few days ago.  She is taking cranberry tablets, vitamin C, using vaginal estrogen and taking probiotics.  Atrophic vaginitis Patient is continuing with the Vagi fem pellets.    PMH: Past Medical History:  Diagnosis Date  . Anxiety   . Arthritis   . Cancer ([place])    lymphoma  . Complication of anesthesia    slow to wake  . HLD (hyperlipidemia)   . HOH (hard of hearing)    aids  . Insomnia   . Mechanical back pain   . Neurocardiogenic syncope   . Osteoarthritis   . Osteopenia   . Postmenopausal status   . Recurrent UTI   . Scoliosis   . Sleep apnea   . Sleep apnea     Surgical History: Past Surgical History:  Procedure Laterality Date  . ABDOMINAL HYSTERECTOMY    . BLADDER SUSPENSION    . CATARACT EXTRACTION W/PHACO Right 12/02/2014    Procedure: CATARACT EXTRACTION PHACO AND INTRAOCULAR LENS PLACEMENT (IOC);  Surgeon: Birder Robson, MD;  Location: [facility] ORS;  Service: Ophthalmology;  Laterality: Right;  [place]  00:38 AP% 41.5 CDE 7.22  . CATARACT EXTRACTION W/PHACO Left 12/16/2014   Procedure: CATARACT EXTRACTION PHACO AND INTRAOCULAR LENS PLACEMENT (IOC);  Surgeon: Birder Robson, MD;  Location: [facility] ORS;  Service: Ophthalmology;  Laterality: Left;  [place] 00:41 AP% 21.4 CDE 8.74  . CHOLECYSTECTOMY    . EYE SURGERY     CATARACT  . TONSILLECTOMY      Home Medications:  Allergies as of 08/29/2016      Reactions   Hydrocodone       Medication List       Accurate as of 08/29/16  3:56 PM. Always use your most recent med list.          acetaminophen 500 MG tablet Commonly known as:  TYLENOL Take 500 mg by mouth.   aspirin 325 MG tablet Take 325-650 mg by mouth.   cefUROXime 500 MG tablet Commonly known as:  CEFTIN Take 500 mg by mouth.   ciprofloxacin 500 MG tablet Commonly known as:  CIPRO Take by mouth.   conjugated estrogens vaginal cream Commonly known as:  PREMARIN Place 1 Applicatorful vaginally daily. Apply 0.5mg  (pea-sized amount)  just inside the vaginal introitus with a finger-tip every  night for two weeks and then Monday, Wednesday and Friday nights.   D 5000 5000 units Tabs Generic drug:  Cholecalciferol Take by mouth.   fosfomycin 3 g Pack Commonly known as:  MONUROL TAKE ONE PACKET EVERY 10 DAYS FOR PREVENTION OF UTI   ibuprofen 200 MG tablet Commonly known as:  ADVIL,MOTRIN Take 200 mg by mouth.   METAMUCIL 0.52 g capsule Generic drug:  psyllium Take by mouth.   mirtazapine 15 MG tablet Commonly known as:  REMERON Take 15 mg by mouth at bedtime.   rosuvastatin 5 MG tablet Commonly known as:  CRESTOR Take 5 mg by mouth at bedtime.   traMADol 50 MG tablet Commonly known as:  ULTRAM Take 25 mg by mouth 3 (three) times daily.   trimethoprim 100 MG tablet Commonly known  as:  TRIMPEX Take 1 tablet (100 mg total) by mouth daily.   VAGIFEM 10 MCG Tabs vaginal tablet Generic drug:  Estradiol Place vaginally.   estradiol 0.1 MG/GM vaginal cream Commonly known as:  ESTRACE VAGINAL Apply 0.5mg  (pea-sized amount)  just inside the vaginal introitus with a finger-tip every night for two weeks and then Monday, Wednesday and Friday nights.       Allergies:  Allergies  Allergen Reactions  . Hydrocodone     Family History: Family History  Problem Relation Age of Onset  . Kidney disease Neg Hx   . Bladder Cancer Neg Hx   . Breast cancer Neg Hx     Social History:  reports that she has quit smoking. She has never used smokeless tobacco. She reports that she drinks alcohol. She reports that she does not use drugs.  ROS: UROLOGY Frequent Urination?: No Hard to postpone urination?: No Burning/pain with urination?: No Get up at night to urinate?: No Leakage of urine?: No Urine stream starts and stops?: No Trouble starting stream?: No Do you have to strain to urinate?: No Blood in urine?: No Urinary tract infection?: No Sexually transmitted disease?: No Injury to kidneys or bladder?: No Painful intercourse?: No Weak stream?: No Currently pregnant?: No Vaginal bleeding?: No Last menstrual period?: n  Gastrointestinal Nausea?: No Vomiting?: No Indigestion/heartburn?: No Diarrhea?: No Constipation?: No  Constitutional Fever: No Night sweats?: No Weight loss?: No Fatigue?: No  Skin Skin rash/lesions?: No Itching?: No  Eyes Blurred vision?: No Double vision?: No  Ears/Nose/Throat Sore throat?: No Sinus problems?: No  Hematologic/Lymphatic Swollen glands?: No Easy bruising?: No  Cardiovascular Leg swelling?: No Chest pain?: No  Respiratory Cough?: No Shortness of breath?: No  Endocrine Excessive thirst?: No  Musculoskeletal Back pain?: No Joint pain?: No  Neurological Headaches?: No Dizziness?:  No  Psychologic Depression?: No Anxiety?: No  Physical Exam: BP (!) 151/94   Pulse 76   Ht 5\' 3"  (1.6 m)   Wt 144 lb 12.8 oz (65.7 kg)   BMI 25.65 kg/m   Constitutional: Well nourished. Alert and oriented, No acute distress. HEENT: [place] AT, moist mucus membranes. Trachea midline, no masses. Cardiovascular: No clubbing, cyanosis, or edema. Respiratory: Normal respiratory effort, no increased work of breathing. Skin: No rashes, bruises or suspicious lesions. Lymph: No cervical or inguinal adenopathy. Neurologic: Grossly intact, no focal deficits, moving all 4 extremities. Psychiatric: Normal mood and affect.  Laboratory Data: Lab Results  Component Value Date   WBC 6.2 09/28/2011   HGB 13.5 09/28/2011   HCT 40.1 09/28/2011   MCV 86 09/28/2011   PLT 243 09/28/2011    Assessment & Plan:    1.  Recurrent UTI  - Patient has been compliant with using UTI prevention strategies with the use of cranberry tablets, vitamin C, probiotics and vaginal estrogen she still is experiencing urinary tract infections  - Will start a suppressive course of antibiotics with trimethoprim 100 mg daily  - She is to contact our office with any breakthrough symptoms of a UTI for further evaluation and management  - She will follow-up in 3 months for PVR and OAB questionnaire  2. Atrophic vaginitis:   Patient will continue applying the vaginal estrogen cream 3 nights weekly. She will follow-up in 12 months for exam and symptom recheck.  Return in about 3 months (around 11/26/2016) for PVR and OAB questionnaire.  These notes generated with voice recognition software. I apologize for typographical errors.  Zara Council, [facility] [address] [place] [address], [place] [address] [phone]

## 2016-11-07 ENCOUNTER — Other Ambulatory Visit: Payer: Self-pay

## 2016-11-07 DIAGNOSIS — N390 Urinary tract infection, site not specified: Secondary | ICD-10-CM

## 2016-11-07 MED ORDER — TRIMETHOPRIM 100 MG PO TABS: 100.0000 mg | ORAL_TABLET | Freq: Every day | ORAL | 0 refills | Status: DC

## 2016-11-23 NOTE — Progress Notes (Signed)
11:28 AM   Natalie Torres 09/23/1940 [id number]  Referring provider: Idelle Crouch, MD [facility] [place], [place] [address]  Chief Complaint  Patient presents with  . Recurrent UTI    3 month follow up  . Vaginitis    HPI: Patient is 60 Caucasian female who presents today for a 3 month follow up after a trial of suppressive antibiotics for recurrent UTI's and vaginal estrogen cream for vaginal atrophy.      Recurrent UTI's Patient has a history of 5 documented UTI's over the last three years.  Risk factors for recurrent UTI's are age and vaginal atrophy. referred by her primary care physician, Dr. Doy Hutching for recurrent urinary tract infections.   She states she has seen Dr. Tresa Endo at [facility].  She states she underwent cystoscopy in 2014 which noted no abnormalities.  She has also had numerous scans due to her history of lymphoma.  Patient recently experienced another UTI with Serratia marcescens for which she was prescribed Cipro through her PCP's office.  At last visit, she was experiencing urgency 3, frequency 7, engages in toilet mapping, incontinence 3 and nocturia 3. She is not having dysuria or suprapubic pain at this time as they have resolved a few days ago.  She was taking cranberry tablets, vitamin C, using vaginal estrogen and taking probiotics.     Today, she is experiencing urgency x 0-3 (stable), frequency x 8 (worse), not restricting fluids to avoid visits to the restroom, is engaging in toilet mapping, incontinence x 0-3 (improved) and nocturia x 3 (stable).  Her PVR is 83 mL.  She is not experiencing gross hematuria, suprapubic pain or dysuria.   She is not experiencing fevers, chills, nausea or vomiting.    She reports one UTI since her visit with [place].    Atrophic vaginitis Patient will continue the vaginal estrogen cream 3 nights weekly.  She has noted some vaginal itching and discharge.  UA is positive for yeast.     PMH: Past Medical History:  Diagnosis Date  . Anxiety   . Arthritis   . Cancer ([place])    lymphoma  . Complication of anesthesia    slow to wake  . HLD (hyperlipidemia)   . HOH (hard of hearing)    aids  . Insomnia   . Mechanical back pain   . Neurocardiogenic syncope   . Osteoarthritis   . Osteopenia   . Postmenopausal status   . Recurrent UTI   . Scoliosis   . Sleep apnea   . Sleep apnea     Surgical History: Past Surgical History:  Procedure Laterality Date  . ABDOMINAL HYSTERECTOMY    . BLADDER SUSPENSION    . CATARACT EXTRACTION W/PHACO Right 12/02/2014   Procedure: CATARACT EXTRACTION PHACO AND INTRAOCULAR LENS PLACEMENT (IOC);  Surgeon: Birder Robson, MD;  Location: [facility] ORS;  Service: Ophthalmology;  Laterality: Right;  [place]  00:38 AP% 41.5 CDE 7.22  . CATARACT EXTRACTION W/PHACO Left 12/16/2014   Procedure: CATARACT EXTRACTION PHACO AND INTRAOCULAR LENS PLACEMENT (IOC);  Surgeon: Birder Robson, MD;  Location: [facility] ORS;  Service: Ophthalmology;  Laterality: Left;  [place] 00:41 AP% 21.4 CDE 8.74  . CHOLECYSTECTOMY    . EYE SURGERY     CATARACT  . TONSILLECTOMY      Home Medications:  Allergies as of 11/24/2016      Reactions   Hydrocodone       Medication List  Accurate as of 11/24/16 11:28 AM. Always use your most recent med list.          acetaminophen 500 MG tablet Commonly known as:  TYLENOL Take 500 mg by mouth.   aspirin 325 MG tablet Take 325-650 mg by mouth.   cefUROXime 500 MG tablet Commonly known as:  CEFTIN Take 500 mg by mouth.   conjugated estrogens vaginal cream Commonly known as:  PREMARIN Place 1 Applicatorful vaginally daily. Apply 0.5mg  (pea-sized amount)  just inside the vaginal introitus with a finger-tip every night for two weeks and then Monday, Wednesday and Friday nights.   D 5000 5000 units Tabs Generic drug:  Cholecalciferol Take by mouth.   fosfomycin 3 g Pack Commonly known as:  MONUROL TAKE ONE  PACKET EVERY 10 DAYS FOR PREVENTION OF UTI   ibuprofen 200 MG tablet Commonly known as:  ADVIL,MOTRIN Take 200 mg by mouth.   METAMUCIL 0.52 g capsule Generic drug:  psyllium Take by mouth.   mirtazapine 15 MG tablet Commonly known as:  REMERON Take 15 mg by mouth at bedtime.   rosuvastatin 5 MG tablet Commonly known as:  CRESTOR Take 5 mg by mouth at bedtime.   terconazole 0.8 % vaginal cream Commonly known as:  TERAZOL 3 Place 1 applicator vaginally at bedtime.   traMADol 50 MG tablet Commonly known as:  ULTRAM Take 25 mg by mouth 3 (three) times daily.   trimethoprim 100 MG tablet Commonly known as:  TRIMPEX Take 1 tablet (100 mg total) by mouth daily.   VAGIFEM 10 MCG Tabs vaginal tablet Generic drug:  Estradiol Place vaginally.   estradiol 0.1 MG/GM vaginal cream Commonly known as:  ESTRACE VAGINAL Apply 0.5mg  (pea-sized amount)  just inside the vaginal introitus with a finger-tip every night for two weeks and then Monday, Wednesday and Friday nights.       Allergies:  Allergies  Allergen Reactions  . Hydrocodone     Family History: Family History  Problem Relation Age of Onset  . Kidney disease Neg Hx   . Bladder Cancer Neg Hx   . Breast cancer Neg Hx   . Kidney cancer Neg Hx     Social History:  reports that she has quit smoking. She has never used smokeless tobacco. She reports that she drinks alcohol. She reports that she does not use drugs.  ROS: UROLOGY Frequent Urination?: Yes Hard to postpone urination?: No Burning/pain with urination?: No Get up at night to urinate?: Yes Leakage of urine?: No Urine stream starts and stops?: No Trouble starting stream?: No Do you have to strain to urinate?: No Blood in urine?: No Urinary tract infection?: No Sexually transmitted disease?: No Injury to kidneys or bladder?: No Painful intercourse?: No Weak stream?: No Currently pregnant?: No Vaginal bleeding?: No Last menstrual period?:  n  Gastrointestinal Nausea?: No Vomiting?: No Indigestion/heartburn?: No Diarrhea?: No Constipation?: No  Constitutional Fever: No Night sweats?: No Weight loss?: No Fatigue?: No  Skin Skin rash/lesions?: No Itching?: No  Eyes Blurred vision?: No Double vision?: No  Ears/Nose/Throat Sore throat?: No Sinus problems?: No  Hematologic/Lymphatic Swollen glands?: No Easy bruising?: No  Cardiovascular Leg swelling?: No Chest pain?: No  Respiratory Cough?: No Shortness of breath?: No  Endocrine Excessive thirst?: No  Musculoskeletal Back pain?: No Joint pain?: No  Neurological Headaches?: No Dizziness?: No  Psychologic Depression?: No Anxiety?: No  Physical Exam: BP (!) 150/92   Pulse 87   Ht 5' 2.5" (1.588 m)   [employer]  140 lb 1.6 oz (63.5 kg)   BMI 25.22 kg/m   Constitutional: Well nourished. Alert and oriented, No acute distress. HEENT: [place] AT, moist mucus membranes. Trachea midline, no masses. Cardiovascular: No clubbing, cyanosis, or edema. Respiratory: Normal respiratory effort, no increased work of breathing. Skin: No rashes, bruises or suspicious lesions. Lymph: No cervical or inguinal adenopathy. Neurologic: Grossly intact, no focal deficits, moving all 4 extremities. Psychiatric: Normal mood and affect.  Laboratory Data: Lab Results  Component Value Date   WBC 6.2 09/28/2011   HGB 13.5 09/28/2011   HCT 40.1 09/28/2011   MCV 86 09/28/2011   PLT 243 09/28/2011    Assessment & Plan:    1. Recurrent UTI  - reviewed UTI prevention strategies with the use of cranberry tablets, vitamin C, probiotics and vaginal estrogen - she still is experiencing urinary tract infections  - Will continue suppressive course of antibiotics with trimethoprim 100 mg daily  - She is to contact our office with any breakthrough symptoms of a UTI for further evaluation and management  - She will follow-up in 3 months for PVR and OAB questionnaire  2. Atrophic  vaginitis:   Patient will continue applying the vaginal estrogen cream 3 nights weekly. She will follow-up in 12 months for exam and symptom recheck.  3. Vaginal yeast infection  - script sent for Terazol 3 cream  Return in about 3 months (around 02/24/2017) for PVR and OAB questionnaire.  These notes generated with voice recognition software. I apologize for typographical errors.  Zara Council, [facility] [address], [place], [place] [address] [phone]

## 2016-11-24 ENCOUNTER — Encounter: Payer: Self-pay | Admitting: Urology

## 2016-11-24 ENCOUNTER — Ambulatory Visit: Payer: Medicare Other | Admitting: Urology

## 2016-11-24 VITALS — BP 150/92 | HR 87 | Ht 62.5 in | Wt 140.1 lb

## 2016-11-24 DIAGNOSIS — B373 Candidiasis of vulva and vagina: Secondary | ICD-10-CM

## 2016-11-24 DIAGNOSIS — B3731 Acute candidiasis of vulva and vagina: Secondary | ICD-10-CM

## 2016-11-24 DIAGNOSIS — N390 Urinary tract infection, site not specified: Secondary | ICD-10-CM

## 2016-11-24 DIAGNOSIS — N952 Postmenopausal atrophic vaginitis: Secondary | ICD-10-CM

## 2016-11-24 LAB — MICROSCOPIC EXAMINATION

## 2016-11-24 LAB — URINALYSIS, COMPLETE
BILIRUBIN UA: NEGATIVE
Glucose, UA: NEGATIVE
KETONES UA: NEGATIVE
Leukocytes, UA: NEGATIVE
Nitrite, UA: NEGATIVE
PH UA: 5.5 (ref 5.0–7.5)
PROTEIN UA: NEGATIVE
RBC UA: NEGATIVE
Specific Gravity, UA: 1.01 (ref 1.005–1.030)
UUROB: 0.2 mg/dL (ref 0.2–1.0)

## 2016-11-24 LAB — BLADDER SCAN AMB NON-IMAGING: Scan Result: 83

## 2016-11-24 MED ORDER — TERCONAZOLE 0.8 % VA CREA: 1.0000 | TOPICAL_CREAM | Freq: Every day | VAGINAL | 0 refills | Status: DC

## 2016-11-24 MED ORDER — TRIMETHOPRIM 100 MG PO TABS: 100.0000 mg | ORAL_TABLET | Freq: Every day | ORAL | 0 refills | Status: DC

## 2016-11-24 NOTE — Patient Instructions (Signed)

## 2017-01-09 DIAGNOSIS — K449 Diaphragmatic hernia without obstruction or gangrene: Secondary | ICD-10-CM | POA: Insufficient documentation

## 2017-02-15 ENCOUNTER — Ambulatory Visit: Payer: Medicare Other | Admitting: Urology

## 2017-02-27 ENCOUNTER — Ambulatory Visit: Payer: Medicare Other | Admitting: Urology

## 2017-05-22 ENCOUNTER — Other Ambulatory Visit: Payer: Self-pay | Admitting: Internal Medicine

## 2017-05-22 DIAGNOSIS — Z1231 Encounter for screening mammogram for malignant neoplasm of breast: Secondary | ICD-10-CM

## 2017-05-28 IMAGING — CR DG ABDOMEN 1V
1 series · 2 of 2 positions shown · non-contrast
Comparison: None.

CLINICAL DATA: Kidney stents.  Recurrent urinary tract infections.

EXAM:
ABDOMEN - 1 VIEW

[Series 1: dg abd 1 view · 0.14mm/px · 2 of 2 slices shown]
[im 1/2]
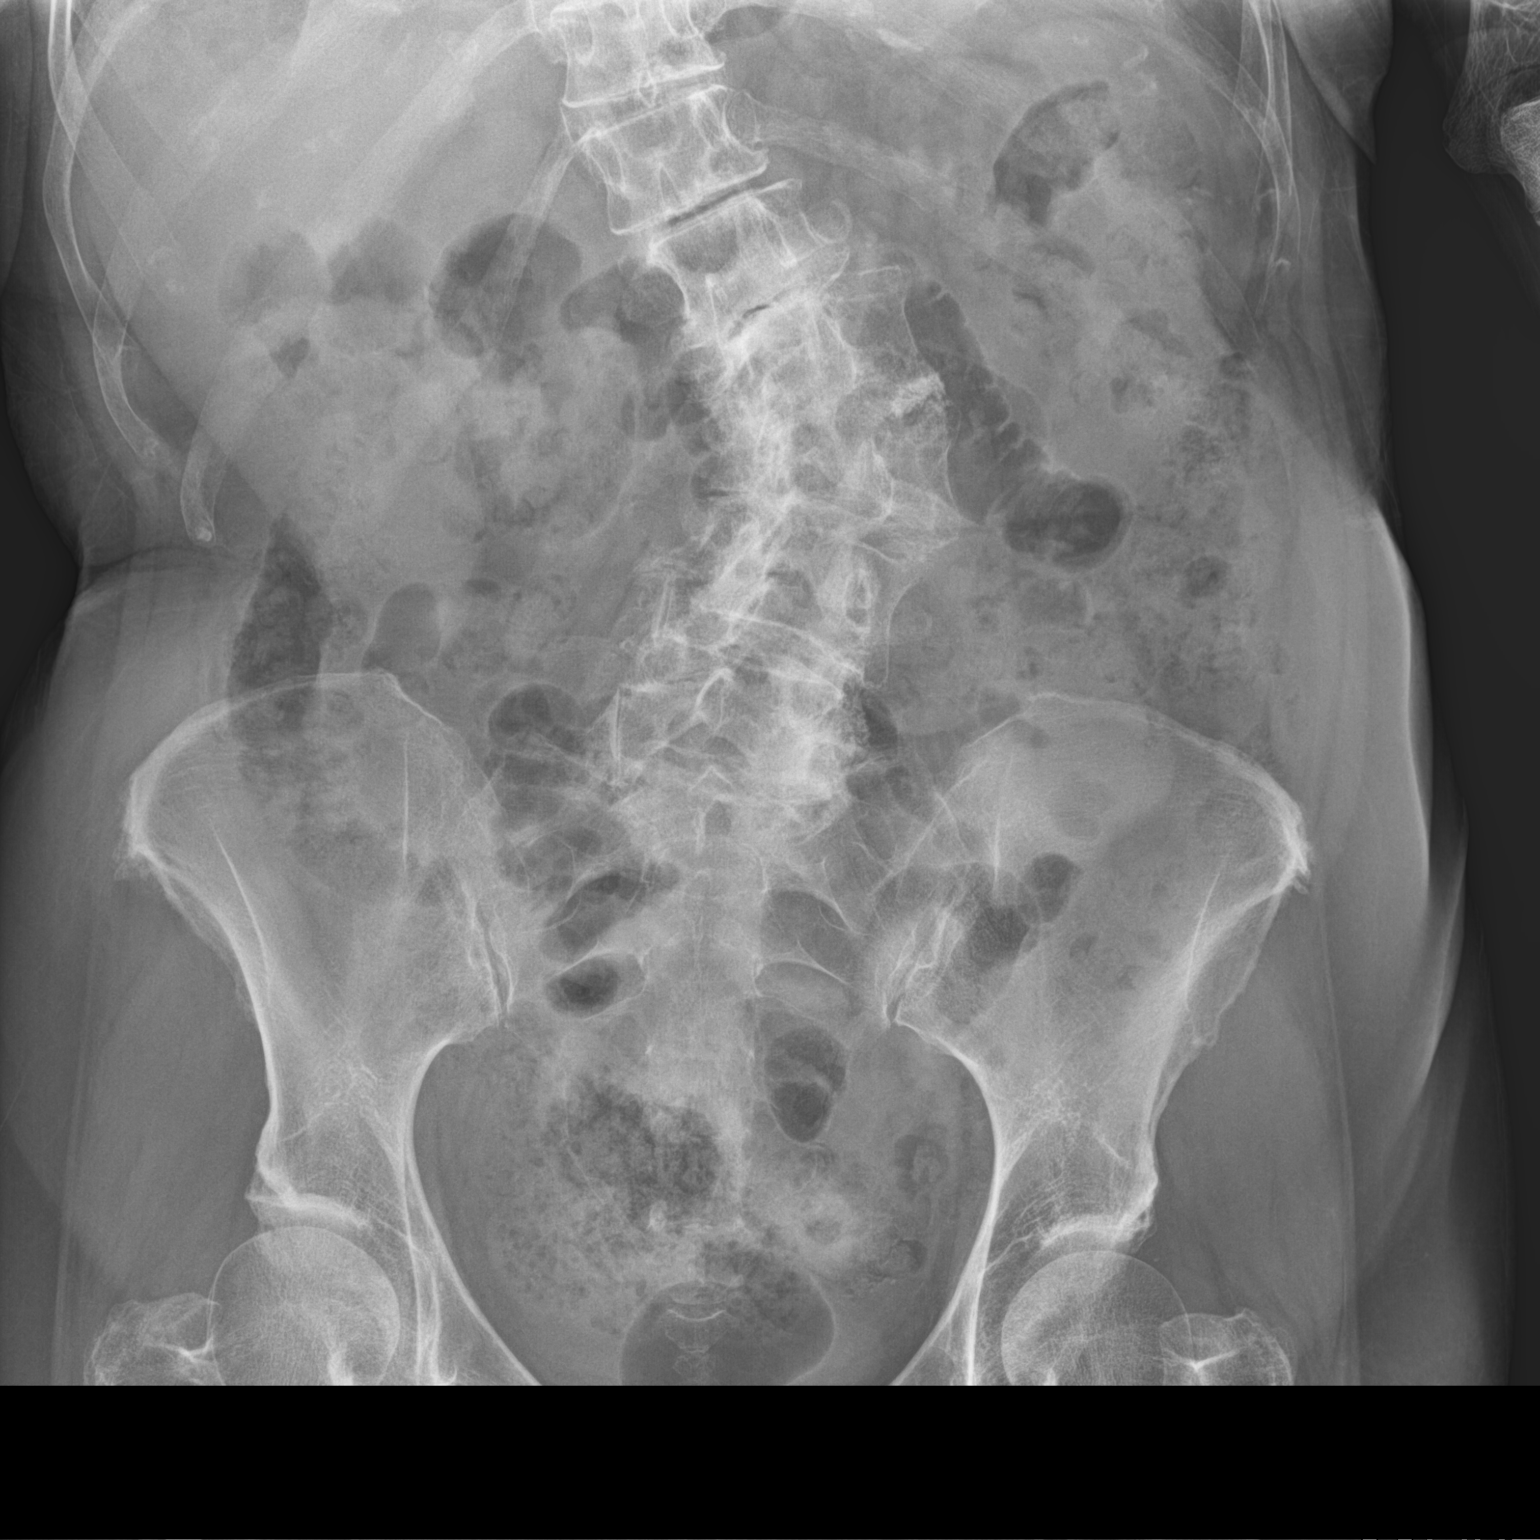
[im 2/2]
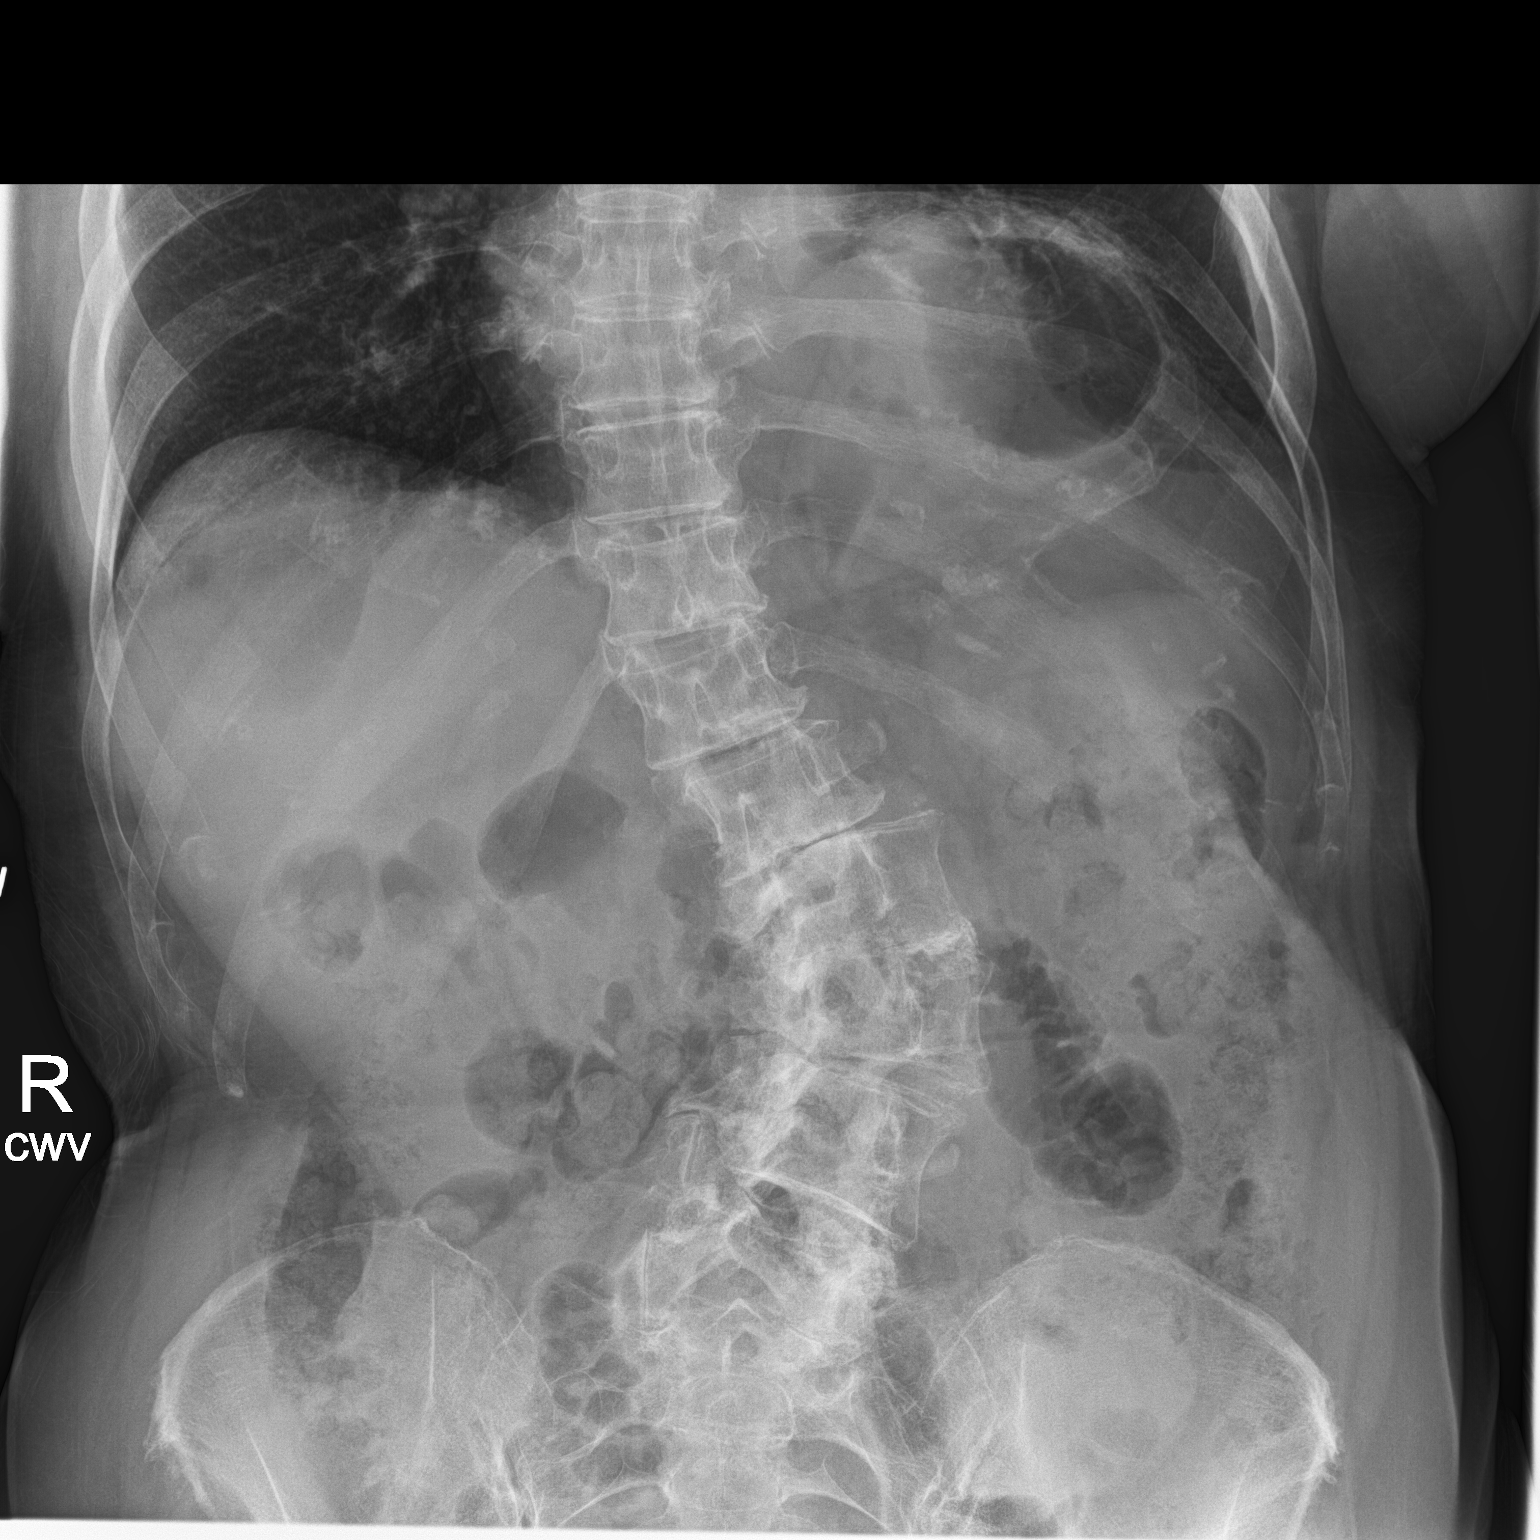

[2 of 2 positions shown; findings below may reference images not displayed]

FINDINGS: Moderate gas and stool is present throughout the bowel. There is no
obstruction or free air. This obscures small stones. No focal kidney
stones are evident. Levoconvex scoliosis is present in the mid
lumbar spine. The pelvis is intact.
IMPRESSION: 1. No definite stones seen. Mild diffuse gas and stool obscure small
stones.
2. Levoconvex scoliosis.

## 2017-06-06 ENCOUNTER — Ambulatory Visit
Admission: RE | Admit: 2017-06-06 | Discharge: 2017-06-06 | Disposition: A | Discharge status: Home / Self Care | Payer: Medicare Other | Source: Ambulatory Visit | Attending: Internal Medicine | Admitting: Internal Medicine

## 2017-06-06 DIAGNOSIS — Z1231 Encounter for screening mammogram for malignant neoplasm of breast: Secondary | ICD-10-CM | POA: Diagnosis present

## 2017-08-08 DIAGNOSIS — I480 Paroxysmal atrial fibrillation: Secondary | ICD-10-CM | POA: Insufficient documentation

## 2017-08-28 ENCOUNTER — Ambulatory Visit: Payer: Medicare Other | Admitting: Urology

## 2017-08-28 ENCOUNTER — Encounter: Payer: Self-pay | Admitting: Urology

## 2017-08-28 VITALS — BP 116/69 | HR 99 | Ht 62.0 in | Wt 130.0 lb

## 2017-08-28 DIAGNOSIS — N390 Urinary tract infection, site not specified: Secondary | ICD-10-CM

## 2017-08-28 LAB — MICROSCOPIC EXAMINATION
Bacteria, UA: NONE SEEN
RBC MICROSCOPIC, UA: NONE SEEN /HPF (ref 0–?)

## 2017-08-28 LAB — URINALYSIS, COMPLETE
Bilirubin, UA: NEGATIVE
Glucose, UA: NEGATIVE
KETONES UA: NEGATIVE
Nitrite, UA: POSITIVE — AB
PH UA: 6 (ref 5.0–7.5)
Protein, UA: NEGATIVE
RBC UA: NEGATIVE
Specific Gravity, UA: 1.01 (ref 1.005–1.030)
Urobilinogen, Ur: 0.2 mg/dL (ref 0.2–1.0)

## 2017-08-28 MED ORDER — CEFUROXIME AXETIL 500 MG PO TABS: 500.0000 mg | ORAL_TABLET | Freq: Two times a day (BID) | ORAL | 0 refills | Status: DC

## 2017-08-28 NOTE — Progress Notes (Signed)
08/28/2017 4:09 PM   Natalie Torres 1941/02/13 [id number]  Referring provider: Idelle Crouch, MD [facility] [place], [place] [address]  Chief Complaint  Patient presents with  . Recurrent UTI    follow up    HPI: 77 year old female presents for follow-up of recurrent UTIs.  She has most recently seen our 69 assistant and has been on cranberry, low-dose vaginal estrogen and probiotics.  She has also been on low-dose antibiotic suppression with trimethoprim which she states was not effective and is no longer taking.  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  She was diagnosed with an infection just prior to that surgery.  Record review remarkable for Klebsiella bacteriuria September, October and November 2018.  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  Her symptoms typically consist of suprapubic discomfort, urinary hesitancy, malaise, nocturia and mild dysuria.  She has had recurrent symptoms in the past several days. She was previously evaluated by Dr. Rosana Hoes at [facility] in 2013/2014 and had a negative cystoscopy.  An abdominal ultrasound performed in 2015 showed normal kidneys bilaterally without calculi, mass or hydronephrosis.   PMH: Past Medical History:  Diagnosis Date  . Anxiety   . Arthritis   . Cancer ([place])    lymphoma  . Complication of anesthesia    slow to wake  . HLD (hyperlipidemia)   . HOH (hard of hearing)    aids  . Insomnia   . Mechanical back pain   . Neurocardiogenic syncope   . Osteoarthritis   . Osteopenia   . Postmenopausal status   . Recurrent UTI   . Scoliosis   . Sleep apnea   . Sleep apnea     Surgical History: Past Surgical History:  Procedure Laterality Date  . ABDOMINAL HYSTERECTOMY    . BLADDER SUSPENSION    . CATARACT EXTRACTION W/PHACO Right 12/02/2014   Procedure: CATARACT  EXTRACTION PHACO AND INTRAOCULAR LENS PLACEMENT (IOC);  Surgeon: Birder Robson, MD;  Location: [facility] ORS;  Service: Ophthalmology;  Laterality: Right;  [place]  00:38 AP% 41.5 CDE 7.22  . CATARACT EXTRACTION W/PHACO Left 12/16/2014   Procedure: CATARACT EXTRACTION PHACO AND INTRAOCULAR LENS PLACEMENT (IOC);  Surgeon: Birder Robson, MD;  Location: [facility] ORS;  Service: Ophthalmology;  Laterality: Left;  [place] 00:41 AP% 21.4 CDE 8.74  . CHOLECYSTECTOMY    . EYE SURGERY     CATARACT  . TONSILLECTOMY      Home Medications:  Allergies as of 08/28/2017      Reactions   Hydrocodone       Medication List        Accurate as of 08/28/17  4:09 PM. Always use your most recent med list.          acetaminophen 500 MG tablet Commonly known as:  TYLENOL Take 500 mg by mouth.   aspirin 325 MG tablet Take 325-650 mg by mouth.   cefUROXime 500 MG tablet Commonly known as:  CEFTIN Take 500 mg by mouth.   conjugated estrogens vaginal cream Commonly known as:  PREMARIN Place 1 Applicatorful vaginally daily. Apply 0.5mg  (pea-sized amount)  just inside the vaginal introitus with a finger-tip every night for two weeks and then Monday, Wednesday and Friday nights.   D 5000 5000 units Tabs Generic drug:  Cholecalciferol Take by mouth.   estradiol 0.1 MG/GM vaginal cream Commonly known  as:  ESTRACE VAGINAL Apply 0.5mg  (pea-sized amount)  just inside the vaginal introitus with a finger-tip every night for two weeks and then Monday, Wednesday and Friday nights.   fosfomycin 3 g Pack Commonly known as:  MONUROL TAKE ONE PACKET EVERY 10 DAYS FOR PREVENTION OF UTI   ibuprofen 200 MG tablet Commonly known as:  ADVIL,MOTRIN Take 200 mg by mouth.   METAMUCIL 0.52 g capsule Generic drug:  psyllium Take by mouth.   mirtazapine 15 MG tablet Commonly known as:  REMERON Take 15 mg by mouth at bedtime.   rosuvastatin 5 MG tablet Commonly known as:  CRESTOR Take 5 mg by mouth at bedtime.     terconazole 0.8 % vaginal cream Commonly known as:  TERAZOL 3 Place 1 applicator vaginally at bedtime.   traMADol 50 MG tablet Commonly known as:  ULTRAM Take 25 mg by mouth 3 (three) times daily.   trimethoprim 100 MG tablet Commonly known as:  TRIMPEX Take 1 tablet (100 mg total) by mouth daily.       Allergies:  Allergies  Allergen Reactions  . Hydrocodone     Family History: Family History  Problem Relation Age of Onset  . Kidney disease Neg Hx   . Bladder Cancer Neg Hx   . Breast cancer Neg Hx   . Kidney cancer Neg Hx     Social History:  reports that she has quit smoking. she has never used smokeless tobacco. She reports that she drinks alcohol. She reports that she does not use drugs.  ROS: UROLOGY Frequent Urination?: Yes Hard to postpone urination?: No Burning/pain with urination?: Yes Get up at night to urinate?: Yes Leakage of urine?: No Urine stream starts and stops?: No Trouble starting stream?: Yes Do you have to strain to urinate?: No Blood in urine?: No Urinary tract infection?: Yes Sexually transmitted disease?: No Injury to kidneys or bladder?: No Painful intercourse?: No Weak stream?: No Currently pregnant?: No Vaginal bleeding?: No Last menstrual period?: n  Gastrointestinal Nausea?: No Vomiting?: No Indigestion/heartburn?: No Diarrhea?: No Constipation?: No  Constitutional Fever: No Night sweats?: No Weight loss?: No Fatigue?: No  Skin Skin rash/lesions?: No Itching?: No  Eyes Blurred vision?: No Double vision?: No  Ears/Nose/Throat Sore throat?: No Sinus problems?: No  Hematologic/Lymphatic Swollen glands?: No Easy bruising?: No  Cardiovascular Leg swelling?: No Chest pain?: No  Respiratory Cough?: No Shortness of breath?: No  Endocrine Excessive thirst?: No  Musculoskeletal Back pain?: Yes Joint pain?: No  Neurological Headaches?: No Dizziness?: No  Psychologic Depression?: No Anxiety?:  No  Physical Exam: BP 116/69   Pulse 99   Ht 5\' 2"  (1.575 m)   Wt 130 lb (59 kg)   BMI 23.78 kg/m   Constitutional:  Alert and oriented, No acute distress. HEENT: [place] AT, moist mucus membranes.  Trachea midline, no masses. Cardiovascular: No clubbing, cyanosis, or edema. Respiratory: Normal respiratory effort, no increased work of breathing. GI: Abdomen is soft, nontender, nondistended, no abdominal masses GU: No CVA tenderness.  Skin: No rashes, bruises or suspicious lesions. Lymph: No cervical or inguinal adenopathy. Neurologic: Grossly intact, no focal deficits, moving all 4 extremities. Psychiatric: Normal mood and affect.  Laboratory Data: Lab Results  Component Value Date   WBC 6.2 09/28/2011   HGB 13.5 09/28/2011   HCT 40.1 09/28/2011   MCV 86 09/28/2011   PLT 243 09/28/2011   Urinalysis Lab Results  Component Value Date   SPECGRAV 1.010 11/24/2016   PHUR 5.5 11/24/2016  COLORU Yellow 11/24/2016   APPEARANCEUR Clear 11/24/2016   LEUKOCYTESUR Negative 11/24/2016   PROTEINUR Negative 11/24/2016   GLUCOSEU Negative 11/24/2016   KETONESU Negative 11/24/2016   RBCU Negative 11/24/2016   BILIRUBINUR Negative 11/24/2016   UUROB 0.2 11/24/2016   NITRITE Negative 11/24/2016    Lab Results  Component Value Date   LABMICR See below: 11/24/2016   WBCUA 0-5 11/24/2016   RBCUA 0-2 11/24/2016   LABEPIT >10E 11/24/2016   BACTERIA Few 11/24/2016     Assessment & Plan:   77 year old female with a long history of recurrent UTIs.  Urinalysis today does show 3-10 WBCs and she is symptomatic.  The urine is nitrite positive.  Culture was ordered.  We will go ahead and start antibiotic therapy pending the culture result.  Her last imaging was in 2015 so I did recommend a follow-up renal ultrasound.  I also discussed repeating her cystoscopy.  1. Recurrent UTI  - Urinalysis, Complete - Ultrasound renal complete; Future - CULTURE, URINE COMPREHENSIVE   Abbie Sons,  MD  [facility] [address], [place], [place] [address] [phone]

## 2017-09-01 ENCOUNTER — Telehealth: Payer: Self-pay

## 2017-09-01 LAB — CULTURE, URINE COMPREHENSIVE

## 2017-09-01 NOTE — Telephone Encounter (Signed)
Spoke with pt in reference to ucx results and abx. Pt voiced understanding. 

## 2017-09-01 NOTE — Telephone Encounter (Signed)
-----   Message from Abbie Sons, MD sent at 09/01/2017 12:54 PM EST ----- Urine culture was positive and sensitive to prescribed antibiotic

## 2017-09-05 ENCOUNTER — Ambulatory Visit
Admission: RE | Admit: 2017-09-05 | Discharge: 2017-09-05 | Disposition: A | Discharge status: Home / Self Care | Payer: Medicare Other | Source: Ambulatory Visit | Attending: Urology | Admitting: Urology

## 2017-09-05 DIAGNOSIS — N390 Urinary tract infection, site not specified: Secondary | ICD-10-CM | POA: Diagnosis not present

## 2017-09-14 ENCOUNTER — Telehealth: Payer: Self-pay

## 2017-09-14 NOTE — Telephone Encounter (Signed)
Spoke with pt in reference to imaging results and bladder distention. Pt voiced understanding. Nurse appt made.

## 2017-09-14 NOTE — Telephone Encounter (Signed)
-----   Message from Abbie Sons, MD sent at 09/10/2017  9:23 AM EST ----- Renal ultrasound showed no kidney abnormalities.  The bladder was noted to be distended.  Would recommend a follow-up nurse visit with a bladder scan for PVR in approximately 2-4 weeks.

## 2017-10-03 ENCOUNTER — Ambulatory Visit: Payer: Medicare Other

## 2017-10-03 VITALS — BP 149/85 | HR 85 | Ht 62.0 in | Wt 128.0 lb

## 2017-10-03 DIAGNOSIS — N390 Urinary tract infection, site not specified: Secondary | ICD-10-CM

## 2017-10-03 DIAGNOSIS — N952 Postmenopausal atrophic vaginitis: Secondary | ICD-10-CM

## 2017-10-03 LAB — BLADDER SCAN AMB NON-IMAGING: Scan Result: 70

## 2017-10-03 LAB — URINALYSIS, COMPLETE
BILIRUBIN UA: NEGATIVE
GLUCOSE, UA: NEGATIVE
Ketones, UA: NEGATIVE
Nitrite, UA: NEGATIVE
PH UA: 6.5 (ref 5.0–7.5)
PROTEIN UA: NEGATIVE
RBC UA: NEGATIVE
SPEC GRAV UA: 1.01 (ref 1.005–1.030)
UUROB: 0.2 mg/dL (ref 0.2–1.0)

## 2017-10-03 LAB — MICROSCOPIC EXAMINATION

## 2017-10-03 MED ORDER — CEFUROXIME AXETIL 500 MG PO TABS: 500.0000 mg | ORAL_TABLET | Freq: Two times a day (BID) | ORAL | 0 refills | Status: DC

## 2017-10-03 MED ORDER — ESTROGENS, CONJUGATED 0.625 MG/GM VA CREA: 1.0000 | TOPICAL_CREAM | Freq: Every day | VAGINAL | 12 refills | Status: DC

## 2017-10-03 NOTE — Progress Notes (Signed)
Pt presents today with c/o urinary frequency, urgency, hard to postpone urination, dysuria, back pain, and lower abd pain. A clean catch was obtained for u/a and cx. Pt has been on abx in the last 30 days for a UTI. PVR: 70.  Blood pressure (!) 149/85, pulse 85, height 5\' 2"  (1.575 m), weight 128 lb (58.1 kg).

## 2017-10-06 LAB — CULTURE, URINE COMPREHENSIVE

## 2017-10-09 ENCOUNTER — Telehealth: Payer: Self-pay

## 2017-10-09 ENCOUNTER — Other Ambulatory Visit: Payer: Self-pay | Admitting: Urology

## 2017-10-09 ENCOUNTER — Encounter: Payer: Self-pay | Admitting: Urology

## 2017-10-09 ENCOUNTER — Other Ambulatory Visit: Payer: Medicare Other

## 2017-10-09 DIAGNOSIS — N390 Urinary tract infection, site not specified: Secondary | ICD-10-CM

## 2017-10-09 NOTE — Telephone Encounter (Signed)
Would repeat the urine culture

## 2017-10-09 NOTE — Telephone Encounter (Signed)
Spoke with pt and made aware will need another urine culture. Pt voiced understanding.

## 2017-10-09 NOTE — Telephone Encounter (Signed)
Spoke with pt in reference to ucx results and abx. Pt stated "This just is not working. I am no better and actually feel a little worse." Reinforced with pt organism is susceptible to abx. Pt stated "Well im no better. Something needs to be done." Please advise.

## 2017-10-09 NOTE — Telephone Encounter (Signed)
-----   Message from Abbie Sons, MD sent at 10/09/2017  7:37 AM EDT ----- Urine culture positive and sensitive to prescribed antibiotic.

## 2017-10-12 LAB — CULTURE, URINE COMPREHENSIVE

## 2017-10-13 ENCOUNTER — Encounter: Payer: Self-pay | Admitting: Urology

## 2017-10-13 ENCOUNTER — Telehealth: Payer: Self-pay

## 2017-10-13 ENCOUNTER — Other Ambulatory Visit: Payer: Self-pay | Admitting: Urology

## 2017-10-13 MED ORDER — NITROFURANTOIN MONOHYD MACRO 100 MG PO CAPS: 100.0000 mg | ORAL_CAPSULE | Freq: Two times a day (BID) | ORAL | 0 refills | Status: DC

## 2017-10-13 NOTE — Telephone Encounter (Signed)
-----   Message from Abbie Sons, MD sent at 10/13/2017  7:37 AM EDT ----- Urine culture grew low level of E. coli.  Rx Macrobid was sent to pharmacy.

## 2017-10-13 NOTE — Telephone Encounter (Signed)
Called and lmom for pt callback

## 2017-10-19 ENCOUNTER — Encounter: Payer: Self-pay | Admitting: Urology

## 2017-10-19 ENCOUNTER — Other Ambulatory Visit: Payer: Self-pay | Admitting: Internal Medicine

## 2017-10-19 DIAGNOSIS — M5030 Other cervical disc degeneration, unspecified cervical region: Secondary | ICD-10-CM

## 2017-10-20 ENCOUNTER — Other Ambulatory Visit: Payer: Self-pay | Admitting: Urology

## 2017-10-20 DIAGNOSIS — N390 Urinary tract infection, site not specified: Secondary | ICD-10-CM

## 2017-10-20 MED ORDER — TRIMETHOPRIM 100 MG PO TABS: 100.0000 mg | ORAL_TABLET | Freq: Every day | ORAL | 0 refills | Status: DC

## 2017-10-25 ENCOUNTER — Encounter: Payer: Self-pay | Admitting: Urology

## 2017-10-30 ENCOUNTER — Ambulatory Visit
Admission: RE | Admit: 2017-10-30 | Discharge: 2017-10-30 | Disposition: A | Discharge status: Home / Self Care | Payer: Medicare Other | Source: Ambulatory Visit | Attending: Internal Medicine | Admitting: Internal Medicine

## 2017-10-30 DIAGNOSIS — M4312 Spondylolisthesis, cervical region: Secondary | ICD-10-CM | POA: Insufficient documentation

## 2017-10-30 DIAGNOSIS — M4802 Spinal stenosis, cervical region: Secondary | ICD-10-CM | POA: Diagnosis not present

## 2017-10-30 DIAGNOSIS — M5030 Other cervical disc degeneration, unspecified cervical region: Secondary | ICD-10-CM | POA: Diagnosis not present

## 2017-10-30 DIAGNOSIS — M4322 Fusion of spine, cervical region: Secondary | ICD-10-CM | POA: Insufficient documentation

## 2018-03-06 DIAGNOSIS — K224 Dyskinesia of esophagus: Secondary | ICD-10-CM | POA: Insufficient documentation

## 2018-05-18 ENCOUNTER — Other Ambulatory Visit: Payer: Self-pay | Admitting: Internal Medicine

## 2018-05-18 DIAGNOSIS — Z1231 Encounter for screening mammogram for malignant neoplasm of breast: Secondary | ICD-10-CM

## 2018-06-11 ENCOUNTER — Ambulatory Visit
Admission: RE | Admit: 2018-06-11 | Discharge: 2018-06-11 | Disposition: A | Discharge status: Home / Self Care | Payer: Medicare Other | Source: Ambulatory Visit | Attending: Internal Medicine | Admitting: Internal Medicine

## 2018-06-11 DIAGNOSIS — Z1231 Encounter for screening mammogram for malignant neoplasm of breast: Secondary | ICD-10-CM | POA: Diagnosis not present

## 2018-09-28 ENCOUNTER — Encounter: Payer: Self-pay | Admitting: *Deleted

## 2018-10-18 ENCOUNTER — Encounter: Payer: Self-pay | Admitting: *Deleted

## 2018-10-23 ENCOUNTER — Ambulatory Visit: Payer: Medicare Other | Admitting: Gastroenterology

## 2018-12-18 ENCOUNTER — Encounter: Payer: Self-pay | Admitting: Gastroenterology

## 2018-12-18 ENCOUNTER — Other Ambulatory Visit: Payer: Self-pay

## 2018-12-18 ENCOUNTER — Ambulatory Visit: Payer: Medicare Other | Admitting: Gastroenterology

## 2018-12-18 VITALS — BP 133/81 | HR 71 | Temp 98.2°F | Ht 62.0 in | Wt 131.6 lb

## 2018-12-18 DIAGNOSIS — M8580 Other specified disorders of bone density and structure, unspecified site: Secondary | ICD-10-CM | POA: Insufficient documentation

## 2018-12-18 DIAGNOSIS — H9190 Unspecified hearing loss, unspecified ear: Secondary | ICD-10-CM | POA: Insufficient documentation

## 2018-12-18 DIAGNOSIS — K580 Irritable bowel syndrome with diarrhea: Secondary | ICD-10-CM | POA: Diagnosis not present

## 2018-12-18 DIAGNOSIS — M419 Scoliosis, unspecified: Secondary | ICD-10-CM | POA: Insufficient documentation

## 2018-12-18 DIAGNOSIS — G4730 Sleep apnea, unspecified: Secondary | ICD-10-CM | POA: Insufficient documentation

## 2018-12-18 DIAGNOSIS — R55 Syncope and collapse: Secondary | ICD-10-CM | POA: Insufficient documentation

## 2018-12-18 DIAGNOSIS — E785 Hyperlipidemia, unspecified: Secondary | ICD-10-CM | POA: Insufficient documentation

## 2018-12-18 DIAGNOSIS — M549 Dorsalgia, unspecified: Secondary | ICD-10-CM | POA: Insufficient documentation

## 2018-12-18 DIAGNOSIS — G4700 Insomnia, unspecified: Secondary | ICD-10-CM | POA: Insufficient documentation

## 2018-12-18 DIAGNOSIS — M1990 Unspecified osteoarthritis, unspecified site: Secondary | ICD-10-CM | POA: Insufficient documentation

## 2018-12-18 DIAGNOSIS — Z780 Asymptomatic menopausal state: Secondary | ICD-10-CM | POA: Insufficient documentation

## 2018-12-18 NOTE — Progress Notes (Signed)
Gastroenterology Consultation  Referring Provider:     Idelle Crouch, MD Primary Care Physician:  Idelle Crouch, MD Primary Gastroenterologist:  Dr. Allen Norris     Reason for Consultation:     Nausea        HPI:   Natalie Torres is a 78 y.o. y/o female referred for consultation & management of nausea by Dr. Doy Hutching, Leonie Douglas, MD.  This patient comes in today after being seen in the past by Dr. Rayann Heman with a history of esophageal spasms.  The patient has a history of A. fib and is seen by cardiology with a recent Holter monitor that did not show any A. fib at that time.  The patient also has a history of recurrent urinary tract infections.  The patient has a history of an EGD on April 20, 2017. Back in March the patient had presented to her primary care provider with the report of having nausea with loose bowel movements.  At that time she was started on Prilosec. The patient says it started after anti-reflux surgery at [facility]. The patient was seen by her previous gastrologist who she reports appeared upset by her implications that her antireflux surgery may have caused her problems. The patient denies any unexplained weight loss fevers chills vomiting or decreased appetite.  She does report that she has intermittent diarrhea that will be present for a few days and then completely resolved.  Past Medical History:  Diagnosis Date  . Anxiety   . Arthritis   . Cancer ([place])    lymphoma  . Complication of anesthesia    slow to wake  . HLD (hyperlipidemia)   . HOH (hard of hearing)    aids  . Insomnia   . Mechanical back pain   . Neurocardiogenic syncope   . Osteoarthritis   . Osteopenia   . Postmenopausal status   . Recurrent UTI   . Scoliosis   . Sleep apnea   . Sleep apnea     Past Surgical History:  Procedure Laterality Date  . ABDOMINAL HYSTERECTOMY    . BLADDER SUSPENSION    . CATARACT EXTRACTION W/PHACO Right 12/02/2014   Procedure: CATARACT EXTRACTION PHACO AND  INTRAOCULAR LENS PLACEMENT (IOC);  Surgeon: Birder Robson, MD;  Location: [facility] ORS;  Service: Ophthalmology;  Laterality: Right;  [place]  00:38 AP% 41.5 CDE 7.22  . CATARACT EXTRACTION W/PHACO Left 12/16/2014   Procedure: CATARACT EXTRACTION PHACO AND INTRAOCULAR LENS PLACEMENT (IOC);  Surgeon: Birder Robson, MD;  Location: [facility] ORS;  Service: Ophthalmology;  Laterality: Left;  [place] 00:41 AP% 21.4 CDE 8.74  . CHOLECYSTECTOMY    . EYE SURGERY     CATARACT  . TONSILLECTOMY      Prior to Admission medications   Medication Sig Start Date End Date Taking? Authorizing Provider  acetaminophen (TYLENOL) 500 MG tablet Take 500 mg by mouth. 10/11/11   [provider]  apixaban (ELIQUIS) 5 MG TABS tablet Take by mouth. 10/30/18   [provider]  aspirin 325 MG tablet Take 325-650 mg by mouth.    [provider]  aspirin buffered (TRI-BUFFERED ASPIRIN) 325 MG TABS tablet Take by mouth.    [provider]  Cholecalciferol (D 5000) 5000 units TABS Take by mouth.    [provider]  conjugated estrogens (PREMARIN) vaginal cream Place 1 Applicatorful vaginally daily. Apply 0.5mg  (pea-sized amount)  just inside the vaginal introitus with a finger-tip every night for two weeks and then Monday, Wednesday  and Friday nights. 10/03/17   Stoioff, Ronda Fairly, MD  Cranberry 200 MG CAPS Take by mouth.    [provider]  estradiol (ESTRACE VAGINAL) 0.1 MG/GM vaginal cream Apply 0.5mg  (pea-sized amount)  just inside the vaginal introitus with a finger-tip every night for two weeks and then Monday, Wednesday and Friday nights. 11/10/15   Zara Council A, PA-C  famotidine (PEPCID) 40 MG tablet Take by mouth. 11/30/17   [provider]  fosfomycin (MONUROL) 3 g PACK TAKE ONE PACKET EVERY 10 DAYS FOR PREVENTION OF UTI 04/24/15   [provider]  ibuprofen (ADVIL,MOTRIN) 200 MG tablet Take 200 mg by mouth. 10/11/11   [provider]  Lactobacillus  Rhamnosus, GG, (CULTURELLE) CAPS Take by mouth.    [provider]  methenamine (HIPREX) 1 g tablet TAKE 1 TABLET (1 G TOTAL) BY MOUTH 2 TIMES DAILY WITH MEALS. 10/11/18   [provider]  metoprolol tartrate (LOPRESSOR) 25 MG tablet Take by mouth. 10/30/18 10/30/19  [provider]  mirtazapine (REMERON) 15 MG tablet Take 15 mg by mouth at bedtime.    [provider]  nitrofurantoin, macrocrystal-monohydrate, (MACROBID) 100 MG capsule Take 1 capsule (100 mg total) by mouth 2 (two) times daily. 10/13/17   Stoioff, Ronda Fairly, MD  omeprazole (PRILOSEC) 40 MG capsule Take by mouth. 09/20/18 09/20/19  [provider]  psyllium (METAMUCIL) 0.52 g capsule Take by mouth. 10/11/11   [provider]  rosuvastatin (CRESTOR) 5 MG tablet Take 5 mg by mouth at bedtime.    [provider]  terconazole (TERAZOL 3) 0.8 % vaginal cream Place 1 applicator vaginally at bedtime. 11/24/16   Zara Council A, PA-C  tiZANidine (ZANAFLEX) 2 MG tablet 1/2-1 tid prn 11/01/17   [provider]  traMADol (ULTRAM) 50 MG tablet Take 25 mg by mouth 3 (three) times daily.    [provider]  trimethoprim (TRIMPEX) 100 MG tablet Take 1 tablet (100 mg total) by mouth daily. 10/20/17   Abbie Sons, MD    Family History  Problem Relation Age of Onset  . Kidney disease Neg Hx   . Bladder Cancer Neg Hx   . Breast cancer Neg Hx   . Kidney cancer Neg Hx      Social History   Tobacco Use  . Smoking status: Former Research scientist (life sciences)  . Smokeless tobacco: Never Used  . Tobacco comment: quit 25 = years  Substance Use Topics  . Alcohol use: Yes    Alcohol/week: 0.0 standard drinks    Comment: occ  . Drug use: No    Allergies as of 12/18/2018 - Review Complete 08/28/2017  Allergen Reaction Noted  . Hydrocodone  11/27/2014    Review of Systems:    All systems reviewed and negative except where noted in HPI.   Physical Exam:  There were no vitals taken for  this visit. No LMP recorded. Patient has had a hysterectomy. General:   Alert,  Well-developed, well-nourished, pleasant and cooperative in NAD Head:  Normocephalic and atraumatic. Eyes:  Sclera clear, no icterus.   Conjunctiva pink. Ears:  Normal auditory acuity. Neck:  Supple; no masses or thyromegaly. Lungs:  Respirations even and unlabored.  Clear throughout to auscultation.   No wheezes, crackles, or rhonchi. No acute distress. Heart:  Regular rate and rhythm; no murmurs, clicks, rubs, or gallops. Abdomen:  Normal bowel sounds.  No bruits.  Soft, non-tender and non-distended without masses, hepatosplenomegaly or hernias noted.  No guarding or rebound  tenderness.  Negative Carnett sign.   Rectal:  Deferred.  Msk:  Symmetrical without gross deformities.  Good, equal movement & strength bilaterally. Pulses:  Normal pulses noted. Extremities:  No clubbing or edema.  No cyanosis. Neurologic:  Alert and oriented x3;  grossly normal neurologically. Skin:  Intact without significant lesions or rashes.  No jaundice. Lymph Nodes:  No significant cervical adenopathy. Psych:  Alert and cooperative. Normal mood and affect.  Imaging Studies: No results found.  Assessment and Plan:   Natalie Torres is a 78 y.o. y/o female who has nausea with some bloating and intermittent diarrhea. The patient has symptoms consistent with irritable bowel syndrome and has been told that this may be a result of her recurrent antibiotic use and even her antibiotics after or before her antireflux surgery.  The patient has been given some samples of Xifaxan to be taken to see if this helps regulate her microbiota and has been told that if this does not work then she should start on VSL #3 probiotics. It is unlikely that this patient has a infectious cause of her symptoms since it is intermittent and she has not been helped with a PPI for her nausea. The patient has been explained the plan and agrees with it.  Lucilla Lame,  MD. Marval Regal    Note: This dictation was prepared with Dragon dictation along with smaller phrase technology. Any transcriptional errors that result from this process are unintentional.

## 2018-12-26 ENCOUNTER — Other Ambulatory Visit: Payer: Self-pay

## 2018-12-26 MED ORDER — RIFAXIMIN 550 MG PO TABS: 550.0000 mg | ORAL_TABLET | Freq: Two times a day (BID) | ORAL | 5 refills | Status: DC

## 2018-12-26 NOTE — Progress Notes (Signed)
.  .  .        xx

## 2019-05-23 ENCOUNTER — Other Ambulatory Visit: Payer: Self-pay | Admitting: Internal Medicine

## 2019-05-23 DIAGNOSIS — Z1231 Encounter for screening mammogram for malignant neoplasm of breast: Secondary | ICD-10-CM

## 2019-06-18 ENCOUNTER — Ambulatory Visit
Admission: RE | Admit: 2019-06-18 | Discharge: 2019-06-18 | Disposition: A | Discharge status: Home / Self Care | Payer: Medicare Other | Source: Ambulatory Visit | Attending: Internal Medicine | Admitting: Internal Medicine

## 2019-06-18 DIAGNOSIS — Z1231 Encounter for screening mammogram for malignant neoplasm of breast: Secondary | ICD-10-CM | POA: Diagnosis not present

## 2019-09-04 ENCOUNTER — Other Ambulatory Visit: Payer: Self-pay

## 2019-09-04 ENCOUNTER — Emergency Department
Admission: EM | Admit: 2019-09-04 | Discharge: 2019-09-04 | Disposition: A | Discharge status: Home / Self Care | Payer: Medicare PPO | Attending: Emergency Medicine | Admitting: Emergency Medicine

## 2019-09-04 DIAGNOSIS — I4891 Unspecified atrial fibrillation: Secondary | ICD-10-CM | POA: Diagnosis not present

## 2019-09-04 DIAGNOSIS — Z79899 Other long term (current) drug therapy: Secondary | ICD-10-CM | POA: Insufficient documentation

## 2019-09-04 DIAGNOSIS — R42 Dizziness and giddiness: Secondary | ICD-10-CM | POA: Insufficient documentation

## 2019-09-04 DIAGNOSIS — Z87891 Personal history of nicotine dependence: Secondary | ICD-10-CM | POA: Diagnosis not present

## 2019-09-04 DIAGNOSIS — Z7901 Long term (current) use of anticoagulants: Secondary | ICD-10-CM | POA: Diagnosis not present

## 2019-09-04 DIAGNOSIS — Z8571 Personal history of Hodgkin lymphoma: Secondary | ICD-10-CM | POA: Diagnosis not present

## 2019-09-04 DIAGNOSIS — R002 Palpitations: Secondary | ICD-10-CM | POA: Diagnosis present

## 2019-09-04 LAB — COMPREHENSIVE METABOLIC PANEL
ALT: 17 U/L (ref 0–44)
AST: 20 U/L (ref 15–41)
Albumin: 4.3 g/dL (ref 3.5–5.0)
Alkaline Phosphatase: 73 U/L (ref 38–126)
Anion gap: 10 (ref 5–15)
BUN: 21 mg/dL (ref 8–23)
CO2: 25 mmol/L (ref 22–32)
Calcium: 9.4 mg/dL (ref 8.9–10.3)
Chloride: 102 mmol/L (ref 98–111)
Creatinine, Ser: 0.98 mg/dL (ref 0.44–1.00)
GFR calc Af Amer: >60 mL/min (ref >60)
GFR calc non Af Amer: 55 mL/min — ABNORMAL LOW (ref >60)
Glucose, Bld: 110 mg/dL — ABNORMAL HIGH (ref 70–99)
Potassium: 3.8 mmol/L (ref 3.5–5.1)
Sodium: 137 mmol/L (ref 135–145)
Total Bilirubin: 0.6 mg/dL (ref 0.3–1.2)
Total Protein: 6.9 g/dL (ref 6.5–8.1)

## 2019-09-04 LAB — TROPONIN I (HIGH SENSITIVITY)
Troponin I (High Sensitivity): 5 ng/L (ref <18)
Troponin I (High Sensitivity): 4 ng/L (ref <18)

## 2019-09-04 LAB — CBC
HCT: 42 % (ref 36.0–46.0)
Hemoglobin: 13.4 g/dL (ref 12.0–15.0)
MCH: 28.6 pg (ref 26.0–34.0)
MCHC: 31.9 g/dL (ref 30.0–36.0)
MCV: 89.6 fL (ref 80.0–100.0)
Platelets: 209 10*3/uL (ref 150–400)
RBC: 4.69 MIL/uL (ref 3.87–5.11)
RDW: 12.7 % (ref 11.5–15.5)
WBC: 6.6 10*3/uL (ref 4.0–10.5)
nRBC: 0 % (ref 0.0–0.2)

## 2019-09-04 MED ORDER — METOPROLOL TARTRATE 25 MG PO TABS
12.5000 mg | ORAL_TABLET | Freq: Once | ORAL | Status: AC
  Administered 2019-09-04: 12.5 mg via ORAL
  Filled 2019-09-04: qty 1

## 2019-09-04 NOTE — ED Provider Notes (Signed)
[facility] Emergency Department Provider Note   ____________________________________________    I have reviewed the triage vital signs and the nursing notes.   HISTORY  Chief Complaint Palpitations     HPI Natalie Torres is a 79 y.o. female with a history of atrial fibrillation on Eliquis presents with complaints of palpitations and lightheadedness.  Patient reports that she is getting ready to go to dinner tonight when she started to feel lightheaded and noticed some palpitations.  She checked her blood pressure and found it to be elevated.  She reports she has had this before related to atrial fibrillation which is typically been well controlled, she does take metoprolol.  Dr. Ubaldo Glassing is her cardiologist.  She denies chest pain.  Currently she is feeling improved.  She reports compliance with her medications.  No shortness of breath.  No fevers.   Past Medical History:  Diagnosis Date  . Anxiety   . Arthritis   . Cancer ([place])    lymphoma  . Complication of anesthesia    slow to wake  . HLD (hyperlipidemia)   . HOH (hard of hearing)    aids  . Insomnia   . Mechanical back pain   . Neurocardiogenic syncope   . Osteoarthritis   . Osteopenia   . Postmenopausal status   . Recurrent UTI   . Scoliosis   . Sleep apnea   . Sleep apnea     Patient Active Problem List   Diagnosis Date Noted  . Hearing loss 12/18/2018  . Hyperlipidemia 12/18/2018  . Insomnia 12/18/2018  . Mechanical back pain 12/18/2018  . Neurocardiogenic syncope 12/18/2018  . Osteoarthritis 12/18/2018  . Osteopenia 12/18/2018  . Postmenopausal status 12/18/2018  . Scoliosis 12/18/2018  . Sleep apnea 12/18/2018  . Esophageal spasm 03/06/2018  . Paroxysmal A-fib ([place]) 08/08/2017  . Hiatal hernia 01/09/2017  . Night sweats 03/09/2016  . Recurrent UTI 10/21/2015  . Left sided abdominal pain 10/21/2015  . Atrophic vaginitis 10/21/2015  . History of recurrent UTIs 04/08/2015   . Multinodular goiter 04/03/2015  . Thyroid nodule 04/03/2015  . Gallstones 06/16/2014  . 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 Chronic cystitis 06/04/2012  . Lymphoma ([place]) 12/09/2011    Past Surgical History:  Procedure Laterality Date  . ABDOMINAL HYSTERECTOMY    . BLADDER SUSPENSION    . CATARACT EXTRACTION W/PHACO Right 12/02/2014   Procedure: CATARACT EXTRACTION PHACO AND INTRAOCULAR LENS PLACEMENT (IOC);  Surgeon: Birder Robson, MD;  Location: [facility] ORS;  Service: Ophthalmology;  Laterality: Right;  [place]  00:38 AP% 41.5 CDE 7.22  . CATARACT EXTRACTION W/PHACO Left 12/16/2014   Procedure: CATARACT EXTRACTION PHACO AND INTRAOCULAR LENS PLACEMENT (IOC);  Surgeon: Birder Robson, MD;  Location: [facility] ORS;  Service: Ophthalmology;  Laterality: Left;  [place] 00:41 AP% 21.4 CDE 8.74  . CHOLECYSTECTOMY    . EYE SURGERY     CATARACT  . TONSILLECTOMY      Prior to Admission medications   Medication Sig Start Date End Date Taking? Authorizing Provider  acetaminophen (TYLENOL) 500 MG tablet Take 500 mg by mouth. 10/11/11   [provider]  apixaban (ELIQUIS) 5 MG TABS tablet Take by mouth. 10/30/18   [provider]  aspirin 325 MG tablet Take 325-650 mg by mouth.    [provider]  Cholecalciferol (D 5000) 5000 units TABS Take by mouth.    [provider]  conjugated estrogens (PREMARIN) vaginal cream Place 1 Applicatorful vaginally daily. Apply 0.5mg  (  pea-sized amount)  just inside the vaginal introitus with a finger-tip every night for two weeks and then Monday, Wednesday and Friday nights. 10/03/17   Stoioff, Ronda Fairly, MD  Cranberry 200 MG CAPS Take by mouth.    [provider]  estradiol (ESTRACE VAGINAL) 0.1 MG/GM vaginal cream Apply 0.5mg  (pea-sized amount)  just inside the vaginal introitus with a finger-tip every night for two weeks and then Monday, Wednesday and Friday nights. 11/10/15   Zara Council A, PA-C  ibuprofen (ADVIL,MOTRIN)  200 MG tablet Take 200 mg by mouth. 10/11/11   [provider]  Lactobacillus Rhamnosus, GG, (CULTURELLE) CAPS Take by mouth.    [provider]  methenamine (HIPREX) 1 g tablet TAKE 1 TABLET (1 G TOTAL) BY MOUTH 2 TIMES DAILY WITH MEALS. 10/11/18   [provider]  metoprolol tartrate (LOPRESSOR) 25 MG tablet Take by mouth. 10/30/18 10/30/19  [provider]  mirtazapine (REMERON) 15 MG tablet Take 15 mg by mouth at bedtime.    [provider]  omeprazole (PRILOSEC) 40 MG capsule Take by mouth. 09/20/18 09/20/19  [provider]  rifaximin (XIFAXAN) 550 MG TABS tablet Take 1 tablet (550 mg total) by mouth 2 (two) times daily. 12/26/18   Lucilla Lame, MD  rosuvastatin (CRESTOR) 5 MG tablet Take 5 mg by mouth at bedtime.    [provider]  tiZANidine (ZANAFLEX) 2 MG tablet 1/2-1 tid prn 11/01/17   [provider]  traMADol (ULTRAM) 50 MG tablet Take 25 mg by mouth 3 (three) times daily.    [provider]     Allergies Hydrocodone  Family History  Problem Relation Age of Onset  . Kidney disease Neg Hx   . Bladder Cancer Neg Hx   . Breast cancer Neg Hx   . Kidney cancer Neg Hx     Social History Social History   Tobacco Use  . Smoking status: Former Research scientist (life sciences)  . Smokeless tobacco: Never Used  . Tobacco comment: quit 25 = years  Substance Use Topics  . Alcohol use: Yes    Alcohol/week: 0.0 standard drinks    Comment: occ  . Drug use: No    Review of Systems  Constitutional: No fever/chills Eyes: No visual changes.  ENT: No sore throat. Cardiovascular: As above Respiratory: Denies shortness of breath. Gastrointestinal: No abdominal pain.   Genitourinary: Negative for dysuria. Musculoskeletal: Negative for back pain. Skin: Negative for rash. Neurological: Negative for headaches or weakness   ____________________________________________   PHYSICAL EXAM:  VITAL SIGNS: ED Triage Vitals [09/04/19  1905]  Enc Vitals Group     BP (!) 160/101     Pulse Rate 84     Resp 20     Temp 98.8 F (37.1 C)     Temp Source Oral     SpO2 100 %     Weight 59 kg (130 lb)     Height 1.575 m (5\' 2" )     Head Circumference      Peak Flow      Pain Score 0     Pain Loc      Pain Edu?      Excl. in [place]?     Constitutional: Alert and oriented. No acute distress. Pleasant and interactive  Nose: No congestion/rhinnorhea. Mouth/Throat: Mucous membranes are moist.   Neck:  Painless ROM Cardiovascular: Normal rate, irregular rhythm grossly normal heart sounds.  Good peripheral circulation. Respiratory: Normal respiratory effort.  No retractions. Lungs CTAB. Gastrointestinal: Soft  and nontender. No distention.  No CVA tenderness.  Musculoskeletal: No lower extremity tenderness nor edema.  Warm and well perfused Neurologic:  Normal speech and language. No gross focal neurologic deficits are appreciated.  Skin:  Skin is warm, dry and intact. No rash noted. Psychiatric: Mood and affect are normal. Speech and behavior are normal.  ____________________________________________   LABS (all labs ordered are listed, but only abnormal results are displayed)  Labs Reviewed  COMPREHENSIVE METABOLIC PANEL - Abnormal; Notable for the following components:      Result Value   Glucose, Bld 110 (*)    GFR calc non Af Amer 55 (*)    All other components within normal limits  CBC  TROPONIN I (HIGH SENSITIVITY)  TROPONIN I (HIGH SENSITIVITY)   ____________________________________________  EKG  ED ECG REPORT I, Lavonia Drafts, the attending physician, personally viewed and interpreted this ECG.  Date: 09/04/2019  Rhythm: Atrial fibrillation QRS Axis: normal Intervals: Abnormal ST/T Wave abnormalities: normal Narrative Interpretation: no evidence of acute  ischemia  ____________________________________________  RADIOLOGY  None ____________________________________________   PROCEDURES  Procedure(s) performed: No  Procedures   Critical Care performed: No ____________________________________________   INITIAL IMPRESSION / ASSESSMENT AND PLAN / ED COURSE  Pertinent labs & imaging results that were available during my care of the patient were reviewed by me and considered in my medical decision making (see chart for details).  Patient well-appearing and in no acute distress.  She is feeling better here in the emergency department and is asymptomatic at this time.  Monitor demonstrates atrial fibrillation but rate controlled.  EKG is reassuring.  Lab work is unremarkable.  Troponin normal x2.  Patient reports that she is not stayed hydrated today and she thinks that is why she became lightheaded.  No occasion for admission at this time given patient feeling improved, will discharge and have him follow-up closely with Dr. Ubaldo Glassing.  Return precautions discussed    ____________________________________________   FINAL CLINICAL IMPRESSION(S) / ED DIAGNOSES  Final diagnoses:  Dizziness  Palpitations        Note:  This document was prepared using Dragon voice recognition software and may include unintentional dictation errors.   Lavonia Drafts, MD 09/04/19 [phone]

## 2019-09-04 NOTE — ED Notes (Signed)
This RN called lab to make sure lab specimen received.  Lab states has the tube.

## 2019-09-04 NOTE — ED Triage Notes (Signed)
Pt In with co heart fluttering, states has hx of afib. Did have dizziness at the time. States has some fluttering, no co pain.

## 2019-09-04 NOTE — ED Notes (Signed)
This RN called lab to inquire about repeat troponin sent.  Lab states has tube and will run it now.

## 2019-10-03 ENCOUNTER — Other Ambulatory Visit: Payer: Self-pay | Admitting: Internal Medicine

## 2019-10-03 DIAGNOSIS — R9389 Abnormal findings on diagnostic imaging of other specified body structures: Secondary | ICD-10-CM

## 2019-10-17 ENCOUNTER — Other Ambulatory Visit: Payer: Self-pay

## 2019-10-17 ENCOUNTER — Ambulatory Visit
Admission: RE | Admit: 2019-10-17 | Discharge: 2019-10-17 | Disposition: A | Discharge status: Home / Self Care | Payer: Medicare PPO | Source: Ambulatory Visit | Attending: Internal Medicine | Admitting: Internal Medicine

## 2019-10-17 DIAGNOSIS — R9389 Abnormal findings on diagnostic imaging of other specified body structures: Secondary | ICD-10-CM | POA: Diagnosis present

## 2019-10-17 MED ORDER — IOHEXOL 300 MG/ML  SOLN
75.0000 mL | Freq: Once | INTRAMUSCULAR | Status: AC | PRN
  Administered 2019-10-17: 75 mL via INTRAVENOUS

## 2020-03-11 ENCOUNTER — Other Ambulatory Visit: Payer: Self-pay | Admitting: Internal Medicine

## 2020-03-11 DIAGNOSIS — Z1231 Encounter for screening mammogram for malignant neoplasm of breast: Secondary | ICD-10-CM

## 2020-06-22 ENCOUNTER — Other Ambulatory Visit: Payer: Self-pay

## 2020-06-22 ENCOUNTER — Ambulatory Visit
Admission: RE | Admit: 2020-06-22 | Discharge: 2020-06-22 | Disposition: A | Discharge status: Home / Self Care | Payer: Medicare PPO | Source: Ambulatory Visit | Attending: Internal Medicine | Admitting: Internal Medicine

## 2020-06-22 DIAGNOSIS — Z1231 Encounter for screening mammogram for malignant neoplasm of breast: Secondary | ICD-10-CM | POA: Insufficient documentation

## 2021-03-30 ENCOUNTER — Other Ambulatory Visit: Payer: Self-pay | Admitting: Internal Medicine

## 2021-03-30 DIAGNOSIS — Z1231 Encounter for screening mammogram for malignant neoplasm of breast: Secondary | ICD-10-CM

## 2021-05-08 ENCOUNTER — Emergency Department: Payer: Medicare PPO

## 2021-05-08 ENCOUNTER — Emergency Department
Admission: EM | Admit: 2021-05-08 | Discharge: 2021-05-08 | Disposition: A | Discharge status: Home / Self Care | Payer: Medicare PPO | Attending: Emergency Medicine | Admitting: Emergency Medicine

## 2021-05-08 ENCOUNTER — Other Ambulatory Visit: Payer: Self-pay

## 2021-05-08 DIAGNOSIS — Z87891 Personal history of nicotine dependence: Secondary | ICD-10-CM | POA: Insufficient documentation

## 2021-05-08 DIAGNOSIS — Z7901 Long term (current) use of anticoagulants: Secondary | ICD-10-CM | POA: Insufficient documentation

## 2021-05-08 DIAGNOSIS — I4891 Unspecified atrial fibrillation: Secondary | ICD-10-CM | POA: Diagnosis not present

## 2021-05-08 DIAGNOSIS — R42 Dizziness and giddiness: Secondary | ICD-10-CM | POA: Insufficient documentation

## 2021-05-08 DIAGNOSIS — R000 Tachycardia, unspecified: Secondary | ICD-10-CM | POA: Diagnosis present

## 2021-05-08 LAB — CBC
HCT: 47.2 % — ABNORMAL HIGH (ref 36.0–46.0)
Hemoglobin: 15.5 g/dL — ABNORMAL HIGH (ref 12.0–15.0)
MCH: 29.4 pg (ref 26.0–34.0)
MCHC: 32.8 g/dL (ref 30.0–36.0)
MCV: 89.4 fL (ref 80.0–100.0)
Platelets: 248 10*3/uL (ref 150–400)
RBC: 5.28 MIL/uL — ABNORMAL HIGH (ref 3.87–5.11)
RDW: 12.3 % (ref 11.5–15.5)
WBC: 12.1 10*3/uL — ABNORMAL HIGH (ref 4.0–10.5)
nRBC: 0 % (ref 0.0–0.2)

## 2021-05-08 LAB — BASIC METABOLIC PANEL
Anion gap: 7 (ref 5–15)
BUN: 20 mg/dL (ref 8–23)
CO2: 27 mmol/L (ref 22–32)
Calcium: 9.6 mg/dL (ref 8.9–10.3)
Chloride: 105 mmol/L (ref 98–111)
Creatinine, Ser: 1.11 mg/dL — ABNORMAL HIGH (ref 0.44–1.00)
GFR, Estimated: 50 mL/min — ABNORMAL LOW (ref >60)
Glucose, Bld: 129 mg/dL — ABNORMAL HIGH (ref 70–99)
Potassium: 3.8 mmol/L (ref 3.5–5.1)
Sodium: 139 mmol/L (ref 135–145)

## 2021-05-08 LAB — URINALYSIS, ROUTINE W REFLEX MICROSCOPIC
Bacteria, UA: NONE SEEN
Bilirubin Urine: NEGATIVE
Glucose, UA: NEGATIVE mg/dL
Ketones, ur: NEGATIVE mg/dL
Nitrite: NEGATIVE
Protein, ur: NEGATIVE mg/dL
Specific Gravity, Urine: 1.019 (ref 1.005–1.030)
pH: 5 (ref 5.0–8.0)

## 2021-05-08 LAB — TROPONIN I (HIGH SENSITIVITY): Troponin I (High Sensitivity): 5 ng/L (ref <18)

## 2021-05-08 MED ORDER — DILTIAZEM HCL 25 MG/5ML IV SOLN: 10.0000 mg | Freq: Once | INTRAVENOUS | Status: AC

## 2021-05-08 MED ORDER — DILTIAZEM HCL 25 MG/5ML IV SOLN
INTRAVENOUS | Status: AC
  Administered 2021-05-08: 10 mg
  Filled 2021-05-08: qty 5

## 2021-05-08 NOTE — ED Notes (Signed)
Repeat EKG obtained. NSR. EDP discussing discharge with pt.

## 2021-05-08 NOTE — ED Triage Notes (Addendum)
Pt comes pov from [facility] with dizziness, syncope, LOC today. Pt in afib RVR with rate from 130-150. Pt has hx of same. Is on thinners. States she got nauseous while on toliet and passed out. Husband came in after she passed out-unknown how long for but thinks less than a min. Pt is now Aox4.

## 2021-05-08 NOTE — ED Notes (Signed)
Helped pt to toilet to void. Pt had steady gait and denied any dizziness. Back in bed now.

## 2021-05-08 NOTE — ED Notes (Signed)
ED provider at bedside.

## 2021-05-08 NOTE — ED Notes (Signed)
Pt she "already feels better" after IV diltiazem. HR now 105-120, a fib. Down from 150.

## 2021-05-08 NOTE — ED Provider Notes (Signed)
[facility] Emergency Department Provider Note  Time seen: 11:40 AM  I have reviewed the triage vital signs and the nursing notes.   HISTORY  Chief Complaint Loss of Consciousness and Atrial Fibrillation   HPI Natalie Torres is a 80 y.o. female with a past medical history of anxiety, paroxysmal atrial fibrillation on Eliquis, Cardizem, presents to the emergency department for rapid heart rate.  According to the patient since last night she has been feeling her atrial fibrillation/rapid heart rate.  States she was feeling somewhat nauseated and lightheaded earlier although states she feels better now.  Denies any chest pain at any point.  Patient states a history of paroxysmal atrial fibrillation follows up with Dr. Ubaldo Glassing of cardiology.  Patient is anticoagulated, takes Cardizem and took an extra dose this morning to try to slow her heart rate down it did not work so the patient came to the emergency department for evaluation.  Currently the patient is in atrial fibrillation with rapid ventricular sponsor of 140 bpm.  States slight nausea denies any current weakness no chest pain or shortness of breath at any point.  No recent illnesses fever cough congestion shortness of breath vomiting or diarrhea.   Past Medical History:  Diagnosis Date   Anxiety    Arthritis    Cancer ([place])    lymphoma   Complication of anesthesia    slow to wake   HLD (hyperlipidemia)    HOH (hard of hearing)    aids   Insomnia    Mechanical back pain    Neurocardiogenic syncope    Osteoarthritis    Osteopenia    Postmenopausal status    Recurrent UTI    Scoliosis    Sleep apnea    Sleep apnea     Patient Active Problem List   Diagnosis Date Noted   Hearing loss 12/18/2018   Hyperlipidemia 12/18/2018   Insomnia 12/18/2018   Mechanical back pain 12/18/2018   Neurocardiogenic syncope 12/18/2018   Osteoarthritis 12/18/2018   Osteopenia 12/18/2018   Postmenopausal status  12/18/2018   Scoliosis 12/18/2018   Sleep apnea 12/18/2018   Esophageal spasm 03/06/2018   Paroxysmal A-fib ([place]) 08/08/2017   Hiatal hernia 01/09/2017   Night sweats 03/09/2016   Recurrent UTI 10/21/2015   Left sided abdominal pain 10/21/2015   Atrophic vaginitis 10/21/2015   History of recurrent UTIs 04/08/2015   Multinodular goiter 04/03/2015   Thyroid nodule 04/03/2015   Gallstones 54/65/0354   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   Chronic cystitis 06/04/2012   Lymphoma ([place]) 12/09/2011    Past Surgical History:  Procedure Laterality Date   ABDOMINAL HYSTERECTOMY     BLADDER SUSPENSION     CATARACT EXTRACTION W/PHACO Right 12/02/2014   Procedure: CATARACT EXTRACTION PHACO AND INTRAOCULAR LENS PLACEMENT ([place]);  Surgeon: Birder Robson, MD;  Location: [facility] ORS;  Service: Ophthalmology;  Laterality: Right;  [place]  00:38 AP% 41.5 CDE 7.22   CATARACT EXTRACTION W/PHACO Left 12/16/2014   Procedure: CATARACT EXTRACTION PHACO AND INTRAOCULAR LENS PLACEMENT ([facility]);  Surgeon: Birder Robson, MD;  Location: [facility] ORS;  Service: Ophthalmology;  Laterality: Left;  [place] 00:41 AP% 21.4 CDE 8.74   CHOLECYSTECTOMY     EYE SURGERY     CATARACT   TONSILLECTOMY      Prior to Admission medications   Medication Sig Start Date End Date Taking? Authorizing Provider  acetaminophen (TYLENOL) 500 MG tablet Take 500 mg by mouth. 10/11/11   [provider]  apixaban (ELIQUIS) 5  MG TABS tablet Take by mouth. 10/30/18   [provider]  aspirin 325 MG tablet Take 325-650 mg by mouth.    [provider]  Cholecalciferol (D 5000) 5000 units TABS Take by mouth.    [provider]  conjugated estrogens (PREMARIN) vaginal cream Place 1 Applicatorful vaginally daily. Apply 0.5mg  (pea-sized amount)  just inside the vaginal introitus with a finger-tip every night for two weeks and then Monday, Wednesday and Friday nights. 10/03/17   Stoioff, Ronda Fairly, MD  Cranberry 200 MG CAPS  Take by mouth.    [provider]  estradiol (ESTRACE VAGINAL) 0.1 MG/GM vaginal cream Apply 0.5mg  (pea-sized amount)  just inside the vaginal introitus with a finger-tip every night for two weeks and then Monday, Wednesday and Friday nights. 11/10/15   Zara Council A, PA-C  ibuprofen (ADVIL,MOTRIN) 200 MG tablet Take 200 mg by mouth. 10/11/11   [provider]  Lactobacillus Rhamnosus, GG, (CULTURELLE) CAPS Take by mouth.    [provider]  methenamine (HIPREX) 1 g tablet TAKE 1 TABLET (1 G TOTAL) BY MOUTH 2 TIMES DAILY WITH MEALS. 10/11/18   [provider]  metoprolol tartrate (LOPRESSOR) 25 MG tablet Take by mouth. 10/30/18 10/30/19  [provider]  mirtazapine (REMERON) 15 MG tablet Take 15 mg by mouth at bedtime.    [provider]  omeprazole (PRILOSEC) 40 MG capsule Take by mouth. 09/20/18 09/20/19  [provider]  rifaximin (XIFAXAN) 550 MG TABS tablet Take 1 tablet (550 mg total) by mouth 2 (two) times daily. 12/26/18   Lucilla Lame, MD  rosuvastatin (CRESTOR) 5 MG tablet Take 5 mg by mouth at bedtime.    [provider]  tiZANidine (ZANAFLEX) 2 MG tablet 1/2-1 tid prn 11/01/17   [provider]  traMADol (ULTRAM) 50 MG tablet Take 25 mg by mouth 3 (three) times daily.    [provider]    Allergies  Allergen Reactions   Hydrocodone     Family History  Problem Relation Age of Onset   Kidney disease Neg Hx    Bladder Cancer Neg Hx    Breast cancer Neg Hx    Kidney cancer Neg Hx     Social History Social History   Tobacco Use   Smoking status: Former   Smokeless tobacco: Never   Tobacco comments:    quit 25 = years  Vaping Use   Vaping Use: Never used  Substance Use Topics   Alcohol use: Yes    Alcohol/week: 0.0 standard drinks    Comment: occ   Drug use: No    Review of Systems Constitutional: Negative for fever.  Lightheadedness earlier now resolved Eyes: Negative for visual  complaints ENT: Negative for recent illness/congestion Cardiovascular: Positive for palpitations Respiratory: Negative for shortness of breath. Gastrointestinal: Negative for abdominal pain, vomiting and diarrhea.  Positive for nausea earlier. Musculoskeletal: Negative for musculoskeletal complaints Skin: Negative for skin complaints  Neurological: Negative for headache All other ROS negative  ____________________________________________   PHYSICAL EXAM:  VITAL SIGNS: ED Triage Vitals [05/08/21 1122]  Enc Vitals Group     BP (!) 115/104     Pulse Rate 86     Resp 18     Temp 98 F (36.7 C)     Temp Source Oral     SpO2 98 %     Weight 136 lb (61.7 kg)     Height 5\' 1"  (1.549 m)     Head Circumference  Peak Flow      Pain Score 2     Pain Loc      Pain Edu?      Excl. in [place]?    Constitutional: Alert and oriented. Well appearing and in no distress. Eyes: Normal exam ENT      Head: Normocephalic and atraumatic.      Mouth/Throat: Mucous membranes are moist. Cardiovascular: Normal rate, regular rhythm. Respiratory: Normal respiratory effort without tachypnea nor retractions. Breath sounds are clear  Gastrointestinal: Soft and nontender. No distention.   Musculoskeletal: Nontender with normal range of motion in all extremities.  Neurologic:  Normal speech and language. No gross focal neurologic deficits  Skin:  Skin is warm, dry and intact.  Psychiatric: Mood and affect are normal  ____________________________________________    EKG  EKG viewed and interpreted by myself shows atrial fibrillation with rapid ventricular sponsor 139 bpm with a narrow QRS, normal axis, normal intervals, no concerning ST changes.  ____________________________________________    RADIOLOGY  CT scan head is negative for acute abnormality  ____________________________________________   INITIAL IMPRESSION / ASSESSMENT AND PLAN / ED COURSE  Pertinent labs & imaging results that  were available during my care of the patient were reviewed by me and considered in my medical decision making (see chart for details).   Patient presents emergency department for atrial fibrillation with rapid ventricular response, nausea and weakness, as well as possible syncopal episode versus near syncopal episode.  Patient states she is feeling better.  Blood pressure currently 250 systolic.  Pulse rate around 140 to 150 bpm.  We will dose 10 mg of IV diltiazem, we will check labs including cardiac enzymes and continue to closely monitor.  Patient agreeable to plan of care.  Given the patient's possible syncope with dizziness we will obtain CT scan of the head.  CT scan head is negative.  Lab work is largely reassuring including a negative troponin.  After 10 mg of IV diltiazem patient's heart rate is now around 60 to 70 bpm.  Repeat EKG viewed and interpreted by myself appears to show a sinus rhythm at 70 bpm, with a narrow QRS, normal axis, normal intervals, no concerning ST changes.  Given the patient's reassuring work-up, conversion back to a normal sinus rhythm and already being anticoagulated for paroxysmal atrial fibrillation I believe the patient would be safe for discharge home with outpatient follow-up with her cardiologist.  Patient is feeling much better and reassured by today's work-up.  Discussed return precautions.  Natalie Torres was evaluated in Emergency Department on 05/08/2021 for the symptoms described in the history of present illness. She was evaluated in the context of the global COVID-19 pandemic, which necessitated consideration that the patient might be at risk for infection with the SARS-CoV-2 virus that causes COVID-19. Institutional protocols and algorithms that pertain to the evaluation of patients at risk for COVID-19 are in a state of rapid change based on information released by regulatory bodies including the CDC and federal and state organizations. These policies  and algorithms were followed during the patient's care in the ED.  ____________________________________________   FINAL CLINICAL IMPRESSION(S) / ED DIAGNOSES  Atrial fibrillation with rapid ventricular response.    Harvest Dark, MD 05/08/21 1400

## 2021-05-08 NOTE — ED Notes (Addendum)
Pt to the ED for LOC, was sitting on toilet and felt dizzy and fell to floor. Pt hit forehead and complains of L hand pain from fall. Pt is alert and oriented at this time. Husband at bedside. Pt has hx a fib and sees cardiologist. Pt takes Eliquis and diltiazem which she took today. Denies chest pain.

## 2021-06-24 ENCOUNTER — Ambulatory Visit
Admission: RE | Admit: 2021-06-24 | Discharge: 2021-06-24 | Disposition: A | Discharge status: Home / Self Care | Payer: Medicare PPO | Source: Ambulatory Visit | Attending: Internal Medicine | Admitting: Internal Medicine

## 2021-06-24 ENCOUNTER — Other Ambulatory Visit: Payer: Self-pay

## 2021-06-24 DIAGNOSIS — Z1231 Encounter for screening mammogram for malignant neoplasm of breast: Secondary | ICD-10-CM | POA: Insufficient documentation

## 2021-08-28 ENCOUNTER — Emergency Department: Payer: Medicare PPO

## 2021-08-28 ENCOUNTER — Emergency Department
Admission: EM | Admit: 2021-08-28 | Discharge: 2021-08-28 | Disposition: A | Discharge status: Home / Self Care | Payer: Medicare PPO | Attending: Emergency Medicine | Admitting: Emergency Medicine

## 2021-08-28 ENCOUNTER — Other Ambulatory Visit: Payer: Self-pay

## 2021-08-28 DIAGNOSIS — Z79899 Other long term (current) drug therapy: Secondary | ICD-10-CM | POA: Insufficient documentation

## 2021-08-28 DIAGNOSIS — I4891 Unspecified atrial fibrillation: Secondary | ICD-10-CM | POA: Insufficient documentation

## 2021-08-28 DIAGNOSIS — Z8572 Personal history of non-Hodgkin lymphomas: Secondary | ICD-10-CM | POA: Diagnosis not present

## 2021-08-28 DIAGNOSIS — Z7901 Long term (current) use of anticoagulants: Secondary | ICD-10-CM | POA: Insufficient documentation

## 2021-08-28 DIAGNOSIS — R42 Dizziness and giddiness: Secondary | ICD-10-CM | POA: Diagnosis present

## 2021-08-28 LAB — MAGNESIUM: Magnesium: 2.4 mg/dL (ref 1.7–2.4)

## 2021-08-28 LAB — CBC
HCT: 41.9 % (ref 36.0–46.0)
Hemoglobin: 13.3 g/dL (ref 12.0–15.0)
MCH: 29.4 pg (ref 26.0–34.0)
MCHC: 31.7 g/dL (ref 30.0–36.0)
MCV: 92.5 fL (ref 80.0–100.0)
Platelets: 209 10*3/uL (ref 150–400)
RBC: 4.53 MIL/uL (ref 3.87–5.11)
RDW: 12.6 % (ref 11.5–15.5)
WBC: 7.3 10*3/uL (ref 4.0–10.5)
nRBC: 0 % (ref 0.0–0.2)

## 2021-08-28 LAB — TROPONIN I (HIGH SENSITIVITY)
Troponin I (High Sensitivity): 6 ng/L (ref <18)
Troponin I (High Sensitivity): 6 ng/L (ref <18)

## 2021-08-28 LAB — BASIC METABOLIC PANEL
Anion gap: 8 (ref 5–15)
BUN: 21 mg/dL (ref 8–23)
CO2: 26 mmol/L (ref 22–32)
Calcium: 9.3 mg/dL (ref 8.9–10.3)
Chloride: 104 mmol/L (ref 98–111)
Creatinine, Ser: 1.17 mg/dL — ABNORMAL HIGH (ref 0.44–1.00)
GFR, Estimated: 47 mL/min — ABNORMAL LOW (ref >60)
Glucose, Bld: 109 mg/dL — ABNORMAL HIGH (ref 70–99)
Potassium: 4.1 mmol/L (ref 3.5–5.1)
Sodium: 138 mmol/L (ref 135–145)

## 2021-08-28 LAB — TSH: TSH: 2.401 u[IU]/mL (ref 0.350–4.500)

## 2021-08-28 LAB — PROTIME-INR
INR: 1.3 — ABNORMAL HIGH (ref 0.8–1.2)
Prothrombin Time: 16.2 seconds — ABNORMAL HIGH (ref 11.4–15.2)

## 2021-08-28 LAB — PHOSPHORUS: Phosphorus: 4.1 mg/dL (ref 2.5–4.6)

## 2021-08-28 MED ORDER — CALCIUM GLUCONATE 10 % IV SOLN
1.0000 g | Freq: Once | INTRAVENOUS | Status: AC
  Administered 2021-08-28: 1 g via INTRAVENOUS
  Filled 2021-08-28: qty 10

## 2021-08-28 MED ORDER — DILTIAZEM HCL 25 MG/5ML IV SOLN
20.0000 mg | Freq: Once | INTRAVENOUS | Status: AC
  Administered 2021-08-28: 20 mg via INTRAVENOUS
  Filled 2021-08-28: qty 5

## 2021-08-28 MED ORDER — MAGNESIUM SULFATE 2 GM/50ML IV SOLN
2.0000 g | Freq: Once | INTRAVENOUS | Status: AC
  Administered 2021-08-28: 2 g via INTRAVENOUS
  Filled 2021-08-28: qty 50

## 2021-08-28 MED ORDER — SODIUM CHLORIDE 0.9 % IV BOLUS
1000.0000 mL | Freq: Once | INTRAVENOUS | Status: AC
  Administered 2021-08-28: 1000 mL via INTRAVENOUS

## 2021-08-28 MED ORDER — DILTIAZEM HCL 60 MG PO TABS
30.0000 mg | ORAL_TABLET | ORAL | Status: AC
Start: 2021-08-28 — End: 2021-08-28
  Administered 2021-08-28: 30 mg via ORAL
  Filled 2021-08-28: qty 1

## 2021-08-28 NOTE — ED Provider Notes (Signed)
[facility] Provider Note    Event Date/Time   First MD Initiated Contact with Patient 08/28/21 1033     (approximate)   History   Tachycardia   HPI  Natalie Torres is a 81 y.o. female with a history of paroxysmal atrial fibrillation on Eliquis, hyperlipidemia, lymphoma who comes the ED complaining of rapid heart rate associated with dizziness with standing.  This is been off and on for the past week, and today she just felt tired of it and wanted to do something about it.  She has an appoint with her cardiologist, Dr. Ubaldo Glassing, this coming week.  Denies any significant chest pain or shortness of breath except for some brief chest discomfort in the middle of the night last night.  She got up drink some water and the pain resolved and then she went back to bed.  No orthopnea.  No cough.  No fever.  She has been compliant with her medications.  Electronic medical record reviewed noting that her rate control regimen currently consists of Cardizem XR 120 mg daily, diltiazem IR 30 mg at night, metoprolol 12.5 mg.     Physical Exam   Triage Vital Signs: ED Triage Vitals  Enc Vitals Group     BP 08/28/21 1032 (!) 150/101     Pulse Rate 08/28/21 1032 (!) 144     Resp 08/28/21 1032 (!) 23     Temp 08/28/21 1032 98.2 F (36.8 C)     Temp Source 08/28/21 1032 Oral     SpO2 08/28/21 1032 98 %     Weight 08/28/21 1030 132 lb (59.9 kg)     Height 08/28/21 1030 5\' 1"  (1.549 m)     Head Circumference --      Peak Flow --      Pain Score 08/28/21 1029 1     Pain Loc --      Pain Edu? --      Excl. in [place]? --     Most recent vital signs: Vitals:   08/28/21 1145 08/28/21 1200  BP: 105/71 116/79  Pulse: 72 85  Resp: 16 19  Temp:    SpO2: 97% 97%     General: Awake, no distress.  CV:  Good peripheral perfusion.  Irregularly irregular rhythm, tachycardia heart rate 130 Resp:  Normal effort.  Clear to auscultation bilaterally.  No tachypnea Abd:  No  distention.  Soft and nontender Other:  No lower extremity edema.  Moist mucosa   ED Results / Procedures / Treatments   Labs (all labs ordered are listed, but only abnormal results are displayed) Labs Reviewed  BASIC METABOLIC PANEL - Abnormal; Notable for the following components:      Result Value   Glucose, Bld 109 (*)    Creatinine, Ser 1.17 (*)    GFR, Estimated 47 (*)    All other components within normal limits  PROTIME-INR - Abnormal; Notable for the following components:   Prothrombin Time 16.2 (*)    INR 1.3 (*)    All other components within normal limits  CBC  MAGNESIUM  PHOSPHORUS  TSH  TROPONIN I (HIGH SENSITIVITY)  TROPONIN I (HIGH SENSITIVITY)     EKG  Interpreted by me Atrial fibrillation with RVR, rate of 138.  Normal axis, normal intervals.  Normal QRS ST segments and T waves.  No ischemic changes.   RADIOLOGY Chest x-ray viewed and interpreted by me, appears unremarkable, no pulmonary edema.  Radiology report reviewed.  PROCEDURES:  Critical Care performed: Yes, see critical care procedure note(s)  .1-3 Lead EKG Interpretation Performed by: Carrie Mew, MD Authorized by: Carrie Mew, MD     Interpretation: abnormal     ECG rate:  140   ECG rate assessment: tachycardic     Rhythm: atrial fibrillation     Ectopy: none     Conduction: normal   Comments:        .Critical Care Performed by: Carrie Mew, MD Authorized by: Carrie Mew, MD   Critical care provider statement:    Critical care time (minutes):  32   Critical care time was exclusive of:  Separately billable procedures and treating other patients   Critical care was necessary to treat or prevent imminent or life-threatening deterioration of the following conditions:  Circulatory failure and cardiac failure   Critical care was time spent personally by me on the following activities:  Development of treatment plan with patient or surrogate, discussions  with consultants, evaluation of patient's response to treatment, examination of patient, obtaining history from patient or surrogate, ordering and performing treatments and interventions, ordering and review of laboratory studies, ordering and review of radiographic studies, pulse oximetry, re-evaluation of patient's condition and review of old charts   MEDICATIONS ORDERED IN ED: Medications  diltiazem (CARDIZEM) injection 20 mg (20 mg Intravenous Given 08/28/21 1055)  magnesium sulfate IVPB 2 g 50 mL (0 g Intravenous Stopped 08/28/21 1137)  sodium chloride 0.9 % bolus 1,000 mL (1,000 mLs Intravenous New Bag/Given 08/28/21 1137)  calcium gluconate inj 10% (1 g) URGENT USE ONLY! (1 g Intravenous Given 08/28/21 1134)  diltiazem (CARDIZEM) tablet 30 mg (30 mg Oral Given 08/28/21 1215)     IMPRESSION / MDM / [place] / ED COURSE  I reviewed the triage vital signs and the nursing notes.                              Differential diagnosis includes, but is not limited to, non-STEMI, electrolyte abnormality, decompensated atrial fibrillation, pulmonary edema  The patient is on the cardiac monitor to evaluate for evidence of arrhythmia and/or significant heart rate changes.  Patient presents with rapid heart rate, found to be in atrial fibrillation with RVR despite being compliant with her medications.  Will give IV diltiazem bolus and IV magnesium.  If she is not improved afterward, can consider electrocardioversion since she is anticoagulated. Clinical Course as of 08/28/21 1330  Sat Aug 28, 2021  1103 Heart rate improved to 76 after medication.  Repeat EKG shows persistent atrial fibrillation.  No ischemic changes.  Patient feels symptoms are improved. [PS]  1211 Labs all unremarkable.  Just waiting for delta troponin.  Vital signs are normal.  Patient is feeling asymptomatic, ambulatory to the toilet.  She is still in atrial fibrillation but rate controlled with a heart rate between 70 and  90.  Offered electrocardioversion under sedation here in the ED since she is already anticoagulated, patient declines and prefers to wait until she sees her cardiologist with her appointment that is already scheduled in 4 days. [PS]    Clinical Course User Index [PS] Carrie Mew, MD    ----------------------------------------- 1:31 PM on 08/28/2021 ----------------------------------------- Repeat troponin normal.  Remains rate controlled.  She does not require admission due to satisfactory rate control, symptom resolution, and reassuring labs.  Stable for discharge.   FINAL CLINICAL IMPRESSION(S) / ED DIAGNOSES   Final diagnoses:  Atrial fibrillation with RVR ([place])     Rx / DC Orders   ED Discharge Orders     None        Note:  This document was prepared using Dragon voice recognition software and may include unintentional dictation errors.   Carrie Mew, MD 08/28/21 1331

## 2021-08-28 NOTE — ED Triage Notes (Signed)
Patient c/o chest discomfort X 1 week. Patient with hx of afib. EKF reading Afib RVR.

## 2021-09-29 DIAGNOSIS — I4819 Other persistent atrial fibrillation: Secondary | ICD-10-CM | POA: Insufficient documentation

## 2021-12-02 ENCOUNTER — Other Ambulatory Visit: Payer: Self-pay | Admitting: Family Medicine

## 2021-12-02 DIAGNOSIS — M5416 Radiculopathy, lumbar region: Secondary | ICD-10-CM

## 2021-12-08 ENCOUNTER — Ambulatory Visit
Admission: RE | Admit: 2021-12-08 | Discharge: 2021-12-08 | Disposition: A | Discharge status: Home / Self Care | Payer: Medicare PPO | Source: Ambulatory Visit | Attending: Family Medicine | Admitting: Family Medicine

## 2021-12-08 DIAGNOSIS — M5416 Radiculopathy, lumbar region: Secondary | ICD-10-CM

## 2022-04-04 DIAGNOSIS — R7303 Prediabetes: Secondary | ICD-10-CM | POA: Insufficient documentation

## 2022-04-06 ENCOUNTER — Other Ambulatory Visit: Payer: Self-pay | Admitting: Internal Medicine

## 2022-04-06 DIAGNOSIS — Z1231 Encounter for screening mammogram for malignant neoplasm of breast: Secondary | ICD-10-CM

## 2022-06-28 ENCOUNTER — Ambulatory Visit
Admission: RE | Admit: 2022-06-28 | Discharge: 2022-06-28 | Disposition: A | Discharge status: Home / Self Care | Payer: Medicare PPO | Source: Ambulatory Visit | Attending: Internal Medicine | Admitting: Internal Medicine

## 2022-06-28 DIAGNOSIS — Z1231 Encounter for screening mammogram for malignant neoplasm of breast: Secondary | ICD-10-CM | POA: Insufficient documentation

## 2022-07-13 ENCOUNTER — Emergency Department
Admission: EM | Admit: 2022-07-13 | Discharge: 2022-07-13 | Disposition: A | Discharge status: Home / Self Care | Payer: Medicare PPO | Attending: Emergency Medicine | Admitting: Emergency Medicine

## 2022-07-13 ENCOUNTER — Other Ambulatory Visit: Payer: Self-pay

## 2022-07-13 DIAGNOSIS — I4891 Unspecified atrial fibrillation: Secondary | ICD-10-CM | POA: Insufficient documentation

## 2022-07-13 LAB — COMPREHENSIVE METABOLIC PANEL
ALT: 15 U/L (ref 0–44)
AST: 20 U/L (ref 15–41)
Albumin: 4.2 g/dL (ref 3.5–5.0)
Alkaline Phosphatase: 68 U/L (ref 38–126)
Anion gap: 7 (ref 5–15)
BUN: 18 mg/dL (ref 8–23)
CO2: 27 mmol/L (ref 22–32)
Calcium: 9.5 mg/dL (ref 8.9–10.3)
Chloride: 108 mmol/L (ref 98–111)
Creatinine, Ser: 1.16 mg/dL — ABNORMAL HIGH (ref 0.44–1.00)
GFR, Estimated: 47 mL/min — ABNORMAL LOW (ref >60)
Glucose, Bld: 98 mg/dL (ref 70–99)
Potassium: 4.3 mmol/L (ref 3.5–5.1)
Sodium: 142 mmol/L (ref 135–145)
Total Bilirubin: 0.5 mg/dL (ref 0.3–1.2)
Total Protein: 7 g/dL (ref 6.5–8.1)

## 2022-07-13 LAB — CBC
HCT: 43.9 % (ref 36.0–46.0)
Hemoglobin: 13.7 g/dL (ref 12.0–15.0)
MCH: 28.8 pg (ref 26.0–34.0)
MCHC: 31.2 g/dL (ref 30.0–36.0)
MCV: 92.4 fL (ref 80.0–100.0)
Platelets: 235 10*3/uL (ref 150–400)
RBC: 4.75 MIL/uL (ref 3.87–5.11)
RDW: 12.9 % (ref 11.5–15.5)
WBC: 7.2 10*3/uL (ref 4.0–10.5)
nRBC: 0 % (ref 0.0–0.2)

## 2022-07-13 LAB — TROPONIN I (HIGH SENSITIVITY): Troponin I (High Sensitivity): 4 ng/L (ref <18)

## 2022-07-13 MED ORDER — DILTIAZEM HCL 60 MG PO TABS
60.0000 mg | ORAL_TABLET | Freq: Once | ORAL | Status: AC
  Administered 2022-07-13: 60 mg via ORAL
  Filled 2022-07-13: qty 1

## 2022-07-13 MED ORDER — SODIUM CHLORIDE 0.9 % IV BOLUS
1000.0000 mL | Freq: Once | INTRAVENOUS | Status: AC
  Administered 2022-07-13: 1000 mL via INTRAVENOUS

## 2022-07-13 MED ORDER — DILTIAZEM HCL 25 MG/5ML IV SOLN
10.0000 mg | Freq: Once | INTRAVENOUS | Status: AC
  Administered 2022-07-13: 10 mg via INTRAVENOUS
  Filled 2022-07-13: qty 5

## 2022-07-13 MED ORDER — AMIODARONE HCL 100 MG PO TABS: 100.0000 mg | ORAL_TABLET | Freq: Every day | ORAL | 2 refills | Status: DC

## 2022-07-13 MED ORDER — DILTIAZEM HCL 25 MG/5ML IV SOLN
10.0000 mg | Freq: Once | INTRAVENOUS | Status: AC
  Administered 2022-07-13: 10 mg via INTRAVENOUS

## 2022-07-13 MED ORDER — AMIODARONE HCL 200 MG PO TABS
100.0000 mg | ORAL_TABLET | Freq: Every day | ORAL | Status: DC
  Administered 2022-07-13: 100 mg via ORAL
  Filled 2022-07-13: qty 1

## 2022-07-13 MED ORDER — DILTIAZEM HCL ER COATED BEADS 240 MG PO CP24: 240.0000 mg | ORAL_CAPSULE | Freq: Every day | ORAL | 11 refills | Status: DC

## 2022-07-13 NOTE — ED Triage Notes (Signed)
Pt here with a fib. Pt states she felt like her heart has been racing for the past 10 days. Pt denies pain. Pt is on eliquis.

## 2022-07-13 NOTE — Discharge Instructions (Signed)
As we discussed please begin taking 240 mg of Cardizem each morning as well as 100 mg of amiodarone each day.  Please follow-up with cardiology by calling the number provided to arrange a follow-up appointment preferably within the next week.  Return to the emergency department for any weakness dizziness or any other symptom personally concerning to yourself.

## 2022-07-13 NOTE — ED Provider Notes (Signed)
[facility] Provider Note    Event Date/Time   First MD Initiated Contact with Patient 07/13/22 1141     (approximate)  History   Chief Complaint: Atrial Fibrillation  HPI  Natalie Torres is a 81 y.o. female with past medical history anxiety, atrial fibrillation on Eliquis, metoprolol, amiodarone per patient, hyperlipidemia, presents to the emergency department for atrial fibrillation.  According to the patient over the past 10 days or so she has felt like her heart is racing in her chest.  Patient states she has taken extra doses of her medication at home to try to help with this which she states has been helping intermittently however today she felt like her heart was very fast which came to the emergency department for patient denies any shortness of breath denies any chest pain.  No recent fever cough or congestion.  No vomiting or diarrhea.  Physical Exam   Triage Vital Signs: ED Triage Vitals [07/13/22 1136]  Enc Vitals Group     BP (!) 148/100     Pulse Rate (!) 125     Resp 18     Temp 98.3 F (36.8 C)     Temp Source Oral     SpO2 97 %     Weight 132 lb 0.9 oz (59.9 kg)     Height '5\' 1"'$  (1.549 m)     Head Circumference      Peak Flow      Pain Score 0     Pain Loc      Pain Edu?      Excl. in [place]?     Most recent vital signs: Vitals:   07/13/22 1136  BP: (!) 148/100  Pulse: (!) 125  Resp: 18  Temp: 98.3 F (36.8 C)  SpO2: 97%    General: Awake, no distress.  CV:  Good peripheral perfusion.  Irregular rhythm rate around 130 bpm. Resp:  Normal effort.  Equal breath sounds bilaterally.  Abd:  No distention.  Soft, nontender.  No rebound or guarding.   ED Results / Procedures / Treatments   EKG  EKG viewed and interpreted by myself shows atrial fibrillation with rapid ventricular response at 130 bpm with a narrow QRS, normal axis, normal intervals, nonspecific ST changes.  MEDICATIONS ORDERED IN ED: Medications  diltiazem  (CARDIZEM) injection 10 mg (has no administration in time range)  sodium chloride 0.9 % bolus 1,000 mL (has no administration in time range)     IMPRESSION / MDM / ASSESSMENT AND PLAN / ED COURSE  I reviewed the triage vital signs and the nursing notes.  Patient's presentation is most consistent with acute presentation with potential threat to life or bodily function.  Patient presents emergency department for atrial fibrillation with rapid ventricular response.  Patient's heart rate currently around 130 bpm.  Patient has chronic atrial fibrillation on Eliquis at baseline.  We will dose IV diltiazem we will dose IV fluids we will check labs including cardiac enzymes and continue to closely monitor.  Overall the patient appears well, no distress, speaking in full sentences without distress.  After 2 doses of IV diltiazem patient's heart rate continues to fluctuate between 90 and 110 bpm.  Patient states she is feeling much better.  Patient's lab work is reassuring with a normal CBC, reassuring chemistry and a negative troponin.  Patient strongly prefers to go home if possible.  As the patient's heart rate appears to be much more controlled I believe  it would be possible for the patient to go home.  We will dose 60 mg of oral diltiazem.  Will attempt to discuss patient with cardiology patient is currently on 120 mg of Cardizem extended release in the morning as well as amiodarone 100 mg every other day.  Patient follows up with Dr. Ubaldo Glassing.   I spoke with Dr. Clayborn Bigness of cardiology.  He agrees that the patient is likely able to go home recommends increasing her amiodarone from 100 mg every other day to 100 mg every day as well as increasing her Cardizem from 120 mg in the morning to 240 mg in the morning.  Patient agreeable to this plan of care.  We will dose diltiazem and amiodarone to discharge.  FINAL CLINICAL IMPRESSION(S) / ED DIAGNOSES   Atrial fibrillation with rapid ventricular  response   Note:  This document was prepared using Dragon voice recognition software and may include unintentional dictation errors.   Harvest Dark, MD 07/13/22 1500

## 2022-07-21 ENCOUNTER — Telehealth: Payer: Self-pay | Admitting: Cardiovascular Disease

## 2022-07-21 NOTE — Telephone Encounter (Signed)
-----   Message from Minna Merritts, MD sent at 07/20/2022  1:42 PM EST ----- Received a request to have this patient see me in clinic, new patient for atrial fibrillation Referring physician is Dr. Anda Latina Home phone [phone] Thx TG

## 2022-07-21 NOTE — Telephone Encounter (Signed)
LVM for patient to call back and schedule NP appointment with Dr. Rockey Situ. He has some available slots on 1/8.

## 2022-09-04 NOTE — Progress Notes (Unsigned)
Cardiology Office Note  Date:  09/05/2022   ID:  Natalie Torres, DOB [birth year], MRN [id number]  PCP:  Idelle Crouch, MD   Chief Complaint  Patient presents with   New Patient (Initial Visit)    Establish care for A-Fib. Patient c/o A-fib spells more frequently lately and shortness of breath. Medications reviewed by the patient verbally.     HPI:  [name] is a 82 year old woman with past medical history of Atrial fibrillation with RVR Who presents by referral from Dr. Beverly Torres for atrial fibrillation  Seen several times in the emergency room for atrial fibrillation October 2022, February 2023, December 2023 On last ER visit was taking amiodarone 100 every other day, diltiazem 120 daily, doses were doubled  On todays visit,  reports feeling better on higher dose diltiazem. Still in atrial fibrillation on todays visit, rates at home 110 bpm She does report some shortness of breath especially climbing up hills  Prior imaging reviewed CT chest April 2021 No significant coronary calcification Minimal aortic atherosclerosis  Echocardiogram November 2022, performed at outside facility Normal LV and RV size and function EF 55% No significant valvular heart disease, mild MR, mild TR Left atrium normal size  EKG personally reviewed by myself on todays visit Atrial fibrillation with ventricular rate 110 bpm nonspecific T wave abnormality  PMH:   has a past medical history of Anxiety, Arthritis, Cancer ([place]), Complication of anesthesia, HLD (hyperlipidemia), HOH (hard of hearing), Insomnia, Mechanical back pain, Neurocardiogenic syncope, Osteoarthritis, Osteopenia, Postmenopausal status, Recurrent UTI, Scoliosis, Sleep apnea, and Sleep apnea.  PSH:    Past Surgical History:  Procedure Laterality Date   ABDOMINAL HYSTERECTOMY     BLADDER SUSPENSION     CATARACT EXTRACTION W/PHACO Right 12/02/2014   Procedure: CATARACT EXTRACTION PHACO AND INTRAOCULAR LENS PLACEMENT  (IOC);  Surgeon: Birder Robson, MD;  Location: [facility] ORS;  Service: Ophthalmology;  Laterality: Right;  [place]  00:38 AP% 41.5 CDE 7.22   CATARACT EXTRACTION W/PHACO Left 12/16/2014   Procedure: CATARACT EXTRACTION PHACO AND INTRAOCULAR LENS PLACEMENT (IOC);  Surgeon: Birder Robson, MD;  Location: [facility] ORS;  Service: Ophthalmology;  Laterality: Left;  [place] 00:41 AP% 21.4 CDE 8.74   CHOLECYSTECTOMY     EYE SURGERY     CATARACT   TONSILLECTOMY      Current Outpatient Medications  Medication Sig Dispense Refill   acetaminophen (TYLENOL) 500 MG tablet Take 500 mg by mouth.     amiodarone (PACERONE) 100 MG tablet Take 1 tablet (100 mg total) by mouth daily. 30 tablet 2   apixaban (ELIQUIS) 5 MG TABS tablet Take 5 mg by mouth 2 (two) times daily.     Cholecalciferol 125 MCG (5000 UT) TABS Take by mouth daily.     diltiazem (CARTIA XT) 240 MG 24 hr capsule Take 1 capsule (240 mg total) by mouth daily. 30 capsule 11   Estradiol 10 MCG TABS vaginal tablet Place 1 tablet vaginally 2 (two) times a week.     Lactobacillus Rhamnosus, GG, (CULTURELLE) CAPS Take by mouth.     meclizine (ANTIVERT) 25 MG tablet Take 25 mg by mouth every 8 (eight) hours as needed.     methenamine (HIPREX) 1 g tablet TAKE 1 TABLET (1 G TOTAL) BY MOUTH 2 TIMES DAILY WITH MEALS.     methocarbamol (ROBAXIN) 500 MG tablet Take 500-1,000 mg by mouth at bedtime as needed.     mirtazapine (REMERON) 15 MG tablet Take 15 mg by mouth at bedtime.  omeprazole (PRILOSEC) 40 MG capsule Take by mouth.     rosuvastatin (CRESTOR) 5 MG tablet Take 5 mg by mouth at bedtime.     traMADol (ULTRAM) 50 MG tablet Take 25 mg by mouth 3 (three) times daily.     mirabegron ER (MYRBETRIQ) 25 MG TB24 tablet Take 1 tablet by mouth daily. (Patient not taking: Reported on 09/05/2022)     tiZANidine (ZANAFLEX) 2 MG tablet 1/2-1 tid prn (Patient not taking: Reported on 09/05/2022)     No current facility-administered medications for this visit.      Allergies:   Hydrocodone   Social History:  The patient  reports that she quit smoking about 33 years ago. Her smoking use included cigarettes. She has a 12.50 pack-year smoking history. She has never used smokeless tobacco. She reports current alcohol use. She reports that she does not use drugs.   Family History:   family history includes Arrhythmia in her sister.    Review of Systems: Review of Systems  Constitutional: Negative.   HENT: Negative.    Respiratory: Negative.    Cardiovascular:  Positive for palpitations.  Gastrointestinal: Negative.   Musculoskeletal: Negative.   Neurological: Negative.   Psychiatric/Behavioral: Negative.    All other systems reviewed and are negative.   PHYSICAL EXAM: VS:  BP 132/80 (BP Location: Right Arm, Patient Position: Sitting, Cuff Size: Normal)   Pulse (!) 110   Ht 5' 1"$  (1.549 m)   Wt 133 lb 2 oz (60.4 kg)   SpO2 96%   BMI 25.15 kg/m  , BMI Body mass index is 25.15 kg/m. GEN: Well nourished, well developed, in no acute distress HEENT: normal Neck: no JVD, carotid bruits, or masses Cardiac: Irregularly irregular no murmurs, rubs, or gallops,no edema  Respiratory:  clear to auscultation bilaterally, normal work of breathing GI: soft, nontender, nondistended, + BS MS: no deformity or atrophy Skin: warm and dry, no rash Neuro:  Strength and sensation are intact Psych: euthymic mood, full affect   Recent Labs: 07/13/2022: ALT 15; BUN 18; Creatinine, Ser 1.16; Hemoglobin 13.7; Platelets 235; Potassium 4.3; Sodium 142    Lipid Panel No results found for: "CHOL", "HDL", "LDLCALC", "TRIG"    Wt Readings from Last 3 Encounters:  09/05/22 133 lb 2 oz (60.4 kg)  07/13/22 132 lb 0.9 oz (59.9 kg)  08/28/21 132 lb (59.9 kg)       ASSESSMENT AND PLAN:  Problem List Items Addressed This Visit       Cardiology Problems   Hyperlipidemia   Paroxysmal A-fib ([place]) - Primary     Other   Follicular lymphoma ([place])   Sleep  apnea   Atrial fibrillation with RVR Likely started in December 2023 and has persisted Recommend she reload amiodarone 400 twice daily for 1 week then down to 200 twice daily Stay on diltiazem ER to 40, we will add metoprolol succinate 25 daily Stay on Eliquis 5 twice daily If she remains in atrial fibrillation in 3 weeks time, we will arrange cardioversion  Hyperlipidemia Continue Crestor  Outside records requested and reviewed Emergency room records reviewed  Total encounter time more than 60 minutes  Greater than 50% was spent in counseling and coordination of care with the patient    Signed, Esmond Plants, M.D., Ph.D. [place], [place]

## 2022-09-05 ENCOUNTER — Encounter: Payer: Self-pay | Admitting: Cardiovascular Disease

## 2022-09-05 ENCOUNTER — Ambulatory Visit: Payer: Medicare PPO | Attending: Cardiovascular Disease | Admitting: Cardiovascular Disease

## 2022-09-05 VITALS — BP 132/80 | HR 110 | Ht 61.0 in | Wt 133.1 lb

## 2022-09-05 DIAGNOSIS — E782 Mixed hyperlipidemia: Secondary | ICD-10-CM | POA: Diagnosis not present

## 2022-09-05 DIAGNOSIS — G4730 Sleep apnea, unspecified: Secondary | ICD-10-CM | POA: Diagnosis not present

## 2022-09-05 DIAGNOSIS — I480 Paroxysmal atrial fibrillation: Secondary | ICD-10-CM

## 2022-09-05 DIAGNOSIS — C8290 Follicular lymphoma, unspecified, unspecified site: Secondary | ICD-10-CM

## 2022-09-05 MED ORDER — APIXABAN 5 MG PO TABS: 5.0000 mg | ORAL_TABLET | Freq: Two times a day (BID) | ORAL | 3 refills | Status: DC

## 2022-09-05 MED ORDER — ROSUVASTATIN CALCIUM 5 MG PO TABS: 5.0000 mg | ORAL_TABLET | Freq: Every day | ORAL | 3 refills | Status: DC

## 2022-09-05 MED ORDER — DILTIAZEM HCL ER COATED BEADS 240 MG PO CP24: 240.0000 mg | ORAL_CAPSULE | Freq: Every day | ORAL | 3 refills | Status: DC

## 2022-09-05 MED ORDER — METOPROLOL SUCCINATE ER 25 MG PO TB24: 25.0000 mg | ORAL_TABLET | Freq: Every day | ORAL | 3 refills | Status: DC

## 2022-09-05 MED ORDER — AMIODARONE HCL 200 MG PO TABS: ORAL_TABLET | ORAL | 0 refills | Status: DC

## 2022-09-05 NOTE — Patient Instructions (Addendum)
Medication Instructions:  Please start amiodarone 400 mg twice a day for 1 week Then down to 200 mg twice a day thereafter   Start metoprolol succinate 25 mg daily in the PM  If you need a refill on your cardiac medications before your next appointment, please call your pharmacy.   Lab work: No new labs needed  Testing/Procedures: No new testing needed  Follow-Up: At [facility], you and your health needs are our priority.  As part of our continuing mission to provide you with exceptional heart care, we have created designated Provider Care Teams.  These Care Teams include your primary Cardiologist (physician) and Advanced Practice Providers (APPs -  Physician Assistants and Nurse Practitioners) who all work together to provide you with the care you need, when you need it.  You will need a follow up appointment in 3 weeks  Providers on your designated Care Team:   Murray Hodgkins, NP Christell Faith, PA-C Cadence Kathlen Mody, [place]  COVID-19 Vaccine Information can be found at: [email or web address].uk For questions related to vaccine distribution or appointments, please email [email or web address] or call [phone].

## 2022-09-16 ENCOUNTER — Other Ambulatory Visit: Payer: Self-pay

## 2022-09-25 NOTE — Progress Notes (Unsigned)
Cardiology Office Note  Date:  09/26/2022   ID:  Natalie Torres, DOB [birth year], MRN [id number]  PCP:  Idelle Crouch, MD   Chief Complaint  Patient presents with   3 week follow up     Patient c/o shortness of breath and A-fib at times. Medications reviewed by the patient verbally.     HPI:  [name] is a 82 year old woman with past medical history of Atrial fibrillation with RVR Who presents for follow-up of her atrial fibrillation atrial fibrillation  Last seen by myself in clinic February 2024 For atrial fibrillation was started on amiodarone load with metoprolol succinate 25 In follow-up today reports that she remains in atrial fibrillation, rate has improved Mild ankle swelling on the right more notable Currently not on diuretics Interested in restoring normal sinus rhythm Has mild sense of anxiety, not sure if this is medication related Sleepy during the day, difficulty getting to sleep at night Denies significant PND orthopnea  EKG personally reviewed by myself on todays visit Atrial fibrillation ventricular rate 87 bpm no significant ST-T wave changes  Other past medical history reviewed Seen several times in the emergency room for atrial fibrillation October 2022, February 2023, December 2023 On last ER visit was taking amiodarone 100 every other day, diltiazem 120 daily, doses were doubled  Prior imaging reviewed CT chest April 2021 No significant coronary calcification Minimal aortic atherosclerosis  Echocardiogram November 2022, performed at outside facility Normal LV and RV size and function EF 55% No significant valvular heart disease, mild MR, mild TR Left atrium normal size  PMH:   has a past medical history of Anxiety, Arthritis, Cancer ([employer]), Complication of anesthesia, HLD (hyperlipidemia), HOH (hard of hearing), Insomnia, Mechanical back pain, Neurocardiogenic syncope, Osteoarthritis, Osteopenia, Postmenopausal status, Recurrent UTI,  Scoliosis, Sleep apnea, and Sleep apnea.  PSH:    Past Surgical History:  Procedure Laterality Date   ABDOMINAL HYSTERECTOMY     BLADDER SUSPENSION     CATARACT EXTRACTION W/PHACO Right 12/02/2014   Procedure: CATARACT EXTRACTION PHACO AND INTRAOCULAR LENS PLACEMENT (IOC);  Surgeon: Birder Robson, MD;  Location: [facility] ORS;  Service: Ophthalmology;  Laterality: Right;  [place]  00:38 AP% 41.5 CDE 7.22   CATARACT EXTRACTION W/PHACO Left 12/16/2014   Procedure: CATARACT EXTRACTION PHACO AND INTRAOCULAR LENS PLACEMENT (IOC);  Surgeon: Birder Robson, MD;  Location: [facility] ORS;  Service: Ophthalmology;  Laterality: Left;  [place] 00:41 AP% 21.4 CDE 8.74   CHOLECYSTECTOMY     EYE SURGERY     CATARACT   TONSILLECTOMY      Current Outpatient Medications  Medication Sig Dispense Refill   acetaminophen (TYLENOL) 500 MG tablet Take 500 mg by mouth.     amiodarone (PACERONE) 200 MG tablet Take 200 mg by mouth 2 (two) times daily.     apixaban (ELIQUIS) 5 MG TABS tablet Take 1 tablet (5 mg total) by mouth 2 (two) times daily. 180 tablet 3   Cholecalciferol 125 MCG (5000 UT) TABS Take by mouth daily.     diltiazem (CARTIA XT) 240 MG 24 hr capsule Take 1 capsule (240 mg total) by mouth daily. 90 capsule 3   Estradiol 10 MCG TABS vaginal tablet Place 1 tablet vaginally 2 (two) times a week.     Lactobacillus Rhamnosus, GG, (CULTURELLE) CAPS Take by mouth.     meclizine (ANTIVERT) 25 MG tablet Take 25 mg by mouth every 8 (eight) hours as needed.     methenamine (HIPREX) 1 g tablet TAKE 1  TABLET (1 G TOTAL) BY MOUTH 2 TIMES DAILY WITH MEALS.     methocarbamol (ROBAXIN) 500 MG tablet Take 500-1,000 mg by mouth at bedtime as needed.     metoprolol succinate (TOPROL-XL) 25 MG 24 hr tablet Take 1 tablet (25 mg total) by mouth daily. Take with or immediately following a meal. 90 tablet 3   mirtazapine (REMERON) 15 MG tablet Take 15 mg by mouth at bedtime.     omeprazole (PRILOSEC) 40 MG capsule Take by mouth.      rosuvastatin (CRESTOR) 5 MG tablet Take 1 tablet (5 mg total) by mouth at bedtime. 90 tablet 3   traMADol (ULTRAM) 50 MG tablet Take 25 mg by mouth 3 (three) times daily.     mirabegron ER (MYRBETRIQ) 25 MG TB24 tablet Take 1 tablet by mouth daily. (Patient not taking: Reported on 09/05/2022)     tiZANidine (ZANAFLEX) 2 MG tablet 1/2-1 tid prn (Patient not taking: Reported on 09/05/2022)     No current facility-administered medications for this visit.    Allergies:   Hydrocodone   Social History:  The patient  reports that she quit smoking about 33 years ago. Her smoking use included cigarettes. She has a 12.50 pack-year smoking history. She has never used smokeless tobacco. She reports current alcohol use. She reports that she does not use drugs.   Family History:   family history includes Arrhythmia in her sister.    Review of Systems: Review of Systems  Constitutional: Negative.   HENT: Negative.    Respiratory: Negative.    Cardiovascular:  Positive for palpitations.  Gastrointestinal: Negative.   Musculoskeletal: Negative.   Neurological: Negative.   Psychiatric/Behavioral: Negative.    All other systems reviewed and are negative.   PHYSICAL EXAM: VS:  BP 120/70 (BP Location: Left Arm, Patient Position: Sitting, Cuff Size: Normal)   Pulse 87   Ht '5\' 1"'$  (1.549 m)   Wt 135 lb (61.2 kg)   SpO2 96%   BMI 25.51 kg/m  , BMI Body mass index is 25.51 kg/m. Constitutional:  oriented to person, place, and time. No distress.  HENT:  Head: Grossly normal Eyes:  no discharge. No scleral icterus.  Neck: No JVD, no carotid bruits  Cardiovascular: Irregularly irregular,  no murmurs appreciated Pulmonary/Chest: Clear to auscultation bilaterally, no wheezes or rails Abdominal: Soft.  no distension.  no tenderness.  Musculoskeletal: Normal range of motion Neurological:  normal muscle tone. Coordination normal. No atrophy Skin: Skin warm and dry Psychiatric: normal affect,  pleasant   Recent Labs: 07/13/2022: ALT 15; BUN 18; Creatinine, Ser 1.16; Hemoglobin 13.7; Platelets 235; Potassium 4.3; Sodium 142    Lipid Panel No results found for: "CHOL", "HDL", "LDLCALC", "TRIG"    Wt Readings from Last 3 Encounters:  09/26/22 135 lb (61.2 kg)  09/05/22 133 lb 2 oz (60.4 kg)  07/13/22 132 lb 0.9 oz (59.9 kg)     ASSESSMENT AND PLAN:  Problem List Items Addressed This Visit       Cardiology Problems   Hyperlipidemia   Relevant Medications   amiodarone (PACERONE) 200 MG tablet   Paroxysmal A-fib ([facility]) - Primary   Relevant Medications   amiodarone (PACERONE) 200 MG tablet     Other   Follicular lymphoma ([place])   Sleep apnea  Atrial fibrillation with RVR Likely started in December 2023 and has persisted despite amiodarone load Recommend she continue amiodarone 200 twice daily, diltiazem ER 240,metoprolol succinate 25 daily Stay on Eliquis 5 twice daily  Will plan for cardioversion next week at her convenience  Hyperlipidemia Continue Crestor  Long discussion concerning management of atrial fibrillation, medications, cardioversion scheduled  Total encounter time more than 30 minutes  Greater than 50% was spent in counseling and coordination of care with the patient    Signed, Esmond Plants, M.D., Ph.D. [place], [place]

## 2022-09-26 ENCOUNTER — Encounter: Payer: Self-pay | Admitting: Cardiovascular Disease

## 2022-09-26 ENCOUNTER — Telehealth: Payer: Self-pay | Admitting: Cardiovascular Disease

## 2022-09-26 ENCOUNTER — Ambulatory Visit: Payer: Medicare PPO | Attending: Cardiovascular Disease | Admitting: Cardiovascular Disease

## 2022-09-26 VITALS — BP 120/70 | HR 87 | Ht 61.0 in | Wt 135.0 lb

## 2022-09-26 DIAGNOSIS — G4730 Sleep apnea, unspecified: Secondary | ICD-10-CM | POA: Diagnosis not present

## 2022-09-26 DIAGNOSIS — C8290 Follicular lymphoma, unspecified, unspecified site: Secondary | ICD-10-CM | POA: Diagnosis not present

## 2022-09-26 DIAGNOSIS — I480 Paroxysmal atrial fibrillation: Secondary | ICD-10-CM | POA: Diagnosis not present

## 2022-09-26 DIAGNOSIS — E782 Mixed hyperlipidemia: Secondary | ICD-10-CM | POA: Diagnosis not present

## 2022-09-26 MED ORDER — POTASSIUM CHLORIDE ER 10 MEQ PO TBCR: 10.0000 meq | EXTENDED_RELEASE_TABLET | ORAL | 3 refills | Status: AC | PRN

## 2022-09-26 MED ORDER — AMIODARONE HCL 200 MG PO TABS: 200.0000 mg | ORAL_TABLET | Freq: Two times a day (BID) | ORAL | 6 refills | Status: DC

## 2022-09-26 MED ORDER — FUROSEMIDE 20 MG PO TABS: 20.0000 mg | ORAL_TABLET | ORAL | 3 refills | Status: AC | PRN

## 2022-09-26 NOTE — Telephone Encounter (Signed)
Returned the call to the pharmacist. She was calling on clarification as to how often PRN the Furosemide and Potassium were supposed to be. She has been advised that it should be once daily.

## 2022-09-26 NOTE — Patient Instructions (Addendum)
Cardioversion for atrial fibrillation next Thursday 10/06/22   Medication Instructions:  Lasix 20 mg daily as needed with potassium 10 meq daily PRN  If you need a refill on your cardiac medications before your next appointment, please call your pharmacy.   Lab work: Your physician recommends that you return for lab work in: [place] Entrance at [facility] 1st desk on the right to check in (REGISTRATION)  Lab hours: Monday- Friday (7:30 am- 5:30 pm)   Testing/Procedures: You are scheduled for a TEE/Cardioversion on Thursday 10/06/22 with Dr. Rockey Situ.    Please arrive at the [facility], [place], [place] at 7:30 am (This is 1 hour prior to your procedure time).  Proceed to the Check-In Desk directly inside the entrance.  Procedure Parking: Use the entrance off of the [place] side of the hospital. Turn right upon entering and follow the driveway to parking that is directly in front of the [place]. There is no valet parking available at this entrance, however there is an awning directly in front of the [place] for drop off/ pick up for patients  DIET: Nothing to eat or drink after midnight except a sip of water with medications (see medication instructions below)  Medication Instructions:  Continue your anticoagulant: apixaban (ELIQUIS) 5 MG TABS tablet If you miss a dose, please call us at [phone] You will need to continue your anticoagulant after your procedure until you are told by your provider that it is safe to stop.   Labs: If patient is on Coumadin, patient needs pt/INR, CBC, BMET within 3 days (No pt/INR needed for patients taking Xarelto, Eliquis, Pradaxa) For patients receiving anesthesia for TEE and all Cardioversion patients: BMET, CBC within 1 week  FYI: For your safety, and to allow [place] to monitor your vital signs accurately during the surgery/procedure we request that if  you have artificial nails, gel coating, SNS etc. Please have those removed prior to your surgery/procedure. Not having the nail coverings /polish removed may result in cancellation or delay of your surgery/procedure.  You must have a responsible person to drive you home and stay in the waiting area during your procedure. Failure to do so could result in cancellation.  Bring your insurance cards.  If you have any questions after you get home, please call the office at [phone]  *Special Note: Every effort is made to have your procedure done on time. Occasionally there are emergencies that occur at the hospital that may cause delays. Please be patient if a delay does occur.       Follow-Up: At [facility], you and your health needs are our priority.  As part of our continuing mission to provide you with exceptional heart care, we have created designated Provider Care Teams.  These Care Teams include your primary Cardiologist (physician) and Advanced Practice Providers (APPs -  Physician Assistants and Nurse Practitioners) who all work together to provide you with the care you need, when you need it.  You will need a follow up appointment in 1 month  Providers on your designated Care Team:   Murray Hodgkins, NP Christell Faith, PA-C Cadence Kathlen Mody, [place]  COVID-19 Vaccine Information can be found at: [email or web address].uk For questions related to vaccine distribution or appointments, please email [email or web address] or call [phone].

## 2022-09-26 NOTE — Telephone Encounter (Signed)
Pt c/o medication issue:  1. Name of Medication:   furosemide (LASIX) 20 MG tablet  potassium chloride (KLOR-CON) 10 MEQ tablet   2. How are you currently taking this medication (dosage and times per day)?   3. Are you having a reaction (difficulty breathing--STAT)?   4. What is your medication issue?   Caller wants clarification on these medications as they are currently prescribed as needed.

## 2022-09-27 ENCOUNTER — Other Ambulatory Visit
Admission: RE | Admit: 2022-09-27 | Discharge: 2022-09-27 | Disposition: A | Discharge status: Home / Self Care | Payer: Medicare PPO | Attending: Cardiovascular Disease | Admitting: Cardiovascular Disease

## 2022-09-27 DIAGNOSIS — I480 Paroxysmal atrial fibrillation: Secondary | ICD-10-CM | POA: Insufficient documentation

## 2022-09-27 LAB — BASIC METABOLIC PANEL
Anion gap: 7 (ref 5–15)
BUN: 24 mg/dL — ABNORMAL HIGH (ref 8–23)
CO2: 25 mmol/L (ref 22–32)
Calcium: 9.3 mg/dL (ref 8.9–10.3)
Chloride: 108 mmol/L (ref 98–111)
Creatinine, Ser: 1.15 mg/dL — ABNORMAL HIGH (ref 0.44–1.00)
GFR, Estimated: 48 mL/min — ABNORMAL LOW (ref >60)
Glucose, Bld: 85 mg/dL (ref 70–99)
Potassium: 4.1 mmol/L (ref 3.5–5.1)
Sodium: 140 mmol/L (ref 135–145)

## 2022-09-27 LAB — CBC
HCT: 42.1 % (ref 36.0–46.0)
Hemoglobin: 13.4 g/dL (ref 12.0–15.0)
MCH: 29.3 pg (ref 26.0–34.0)
MCHC: 31.8 g/dL (ref 30.0–36.0)
MCV: 92.1 fL (ref 80.0–100.0)
Platelets: 216 10*3/uL (ref 150–400)
RBC: 4.57 MIL/uL (ref 3.87–5.11)
RDW: 13.2 % (ref 11.5–15.5)
WBC: 7.8 10*3/uL (ref 4.0–10.5)
nRBC: 0 % (ref 0.0–0.2)

## 2022-10-02 ENCOUNTER — Encounter: Payer: Self-pay | Admitting: Cardiovascular Disease

## 2022-10-04 ENCOUNTER — Telehealth: Payer: Self-pay | Admitting: Cardiovascular Disease

## 2022-10-04 NOTE — Telephone Encounter (Signed)
Patient states her heart rhythm has returned to normal and today her HR is reading is 58.  Patient is concerned if she will need to have upcoming procedure.

## 2022-10-04 NOTE — Telephone Encounter (Signed)
Left message for pt to call back  °

## 2022-10-04 NOTE — Telephone Encounter (Signed)
Spoke w/ pt. She reports that since last Wed, she has been feeling great w/ no lightheadedness. Her BP has been running good, last reading was 131/81. She reports that whenever she feels her radial pulse, her HR seems to be normal, just a little slow at 58 and she would like to know if she should cancel her upcoming cardioversion on 10/06/22. Advised her that NSR will most likely need to be confirmed w/ an EKG, but I am not in the office, so will send a message to Dr. Donivan Scull nurse to see if she can be worked in. She is appreciative and will await a call back.

## 2022-10-05 ENCOUNTER — Other Ambulatory Visit: Payer: Self-pay | Admitting: Cardiovascular Disease

## 2022-10-05 DIAGNOSIS — I480 Paroxysmal atrial fibrillation: Secondary | ICD-10-CM

## 2022-10-06 ENCOUNTER — Encounter: Payer: Self-pay | Admitting: General Practice

## 2022-10-06 ENCOUNTER — Ambulatory Visit
Admission: RE | Admit: 2022-10-06 | Discharge: 2022-10-06 | Disposition: A | Discharge status: Home / Self Care | Payer: Medicare PPO | Attending: Cardiovascular Disease | Admitting: Cardiovascular Disease

## 2022-10-06 ENCOUNTER — Encounter
Admission: RE | Disposition: A | Discharge status: Home / Self Care | Payer: Self-pay | Source: Home / Self Care | Attending: Cardiovascular Disease

## 2022-10-06 ENCOUNTER — Encounter: Payer: Self-pay | Admitting: Cardiovascular Disease

## 2022-10-06 DIAGNOSIS — I4891 Unspecified atrial fibrillation: Secondary | ICD-10-CM | POA: Insufficient documentation

## 2022-10-06 DIAGNOSIS — Z538 Procedure and treatment not carried out for other reasons: Secondary | ICD-10-CM | POA: Diagnosis not present

## 2022-10-06 DIAGNOSIS — I480 Paroxysmal atrial fibrillation: Secondary | ICD-10-CM

## 2022-10-06 HISTORY — PX: CARDIOVERSION: SHX1299

## 2022-10-06 SURGERY — CARDIOVERSION
Anesthesia: General

## 2022-10-06 MED ORDER — SODIUM CHLORIDE 0.9 % IV SOLN: INTRAVENOUS | Status: DC

## 2022-10-06 NOTE — Telephone Encounter (Signed)
In reviewing the patient's chart, she is currently at [facility]. EKG results from this morning are currently pending.

## 2022-10-06 NOTE — Telephone Encounter (Signed)
Natalie Merritts, MD  Sent: Wed October 05, 2022  7:01 PM  To: [facility] Triage         Message  Just checking this message now evening of March 20  To late to get the patient in for EKG  If she presents early in the morning for cardioversion and is normal sinus rhythm, procedure will be canceled  If she does not present for procedure, we can order EKG through the hospital  Thx  TGollan

## 2022-10-06 NOTE — Progress Notes (Signed)
Patient is in sinus rhythm. MD made aware. Patient given instructions to STOP metoprolol and continue to monitor HR and BP at home. They will notify office if changes are needed.

## 2022-10-07 NOTE — Telephone Encounter (Signed)
Reviewed the patient's chart. She was in NSR on 3/21 and did not require DCCV. She is scheduled to follow up with Gerrie Nordmann, NP on 10/28/22.

## 2022-10-27 NOTE — Progress Notes (Signed)
Cardiology Office Note:   Date:  10/28/2022  ID:  Natalie Torres, DOB 1941/06/18, MRN [id number]  History of Present Illness:   Natalie Torres is a 82 y.o. female with a past medical history of paroxysmal atrial fibrillation, mixed HLD, neurocardiogenic syncope, follicular lymphoma, sleep apnea, who is here today for follow-up.  She was last seen in clinic 09/26/22 by Dr. Mariah Milling with shortness of breath and atrial fibrillation at times. Last echo in 11/22 demonstrated an LVEF >55%, mild MR and TR. Continued on amiodarone and Eliquis and she was scheduled for an elective cardioversion the following week.   She presented to her appointment for scheduled cardioversion. An EKG was performed and she was noted to be in sinus. Her cardioversion was cancelled and she had follow up scheduled.   She returns to clinic today accompanied by her husband. She states that she has been doing fairly well. She has had some dizziness and is requesting to decrease her amiodarone from twice daily dosing to daily dosing. She occasionally has some peripheral edema. Previously she had been taking Toprol XL which due to side effects has been discontinued. She denies any chest pain or shortness of breath.  She denies any bleeding or noted blood in urine and stool and has been on apixaban for stroke prophylaxis for several years.  She continues to remain in sinus after previously being scheduled for an elective cardioversion that was cancelled due to her converting after being loaded on amiodarone.  Denies any recent hospitalizations or visits to the emergency department.  Surgical risk follow-up with her PCP as well.  ROS: 10 point review of systems has been completed and is considered negative with exception of that listed in the HPI  Studies Reviewed:    EKG: Sinus bradycardia with a rate of 57, no acute changes from prior studies  TTE 05/26/21 from outside hospital INTERPRETATION  NORMAL LEFT VENTRICULAR SYSTOLIC FUNCTION   NORMAL RIGHT VENTRICULAR SYSTOLIC FUNCTION  NO VALVULAR STENOSIS  MILD MR, TR, PR  EF >55%  Closest EF: >55% (Estimated)  Mitral: MILD MR  Tricuspid: MILD TR   Risk Assessment/Calculations:    CHA2DS2-VASc Score = 3   This indicates a 3.2% annual risk of stroke. The patient's score is based upon: CHF History: 0 HTN History: 0 Diabetes History: 0 Stroke History: 0 Vascular Disease History: 0 Age Score: 2 Gender Score: 1             Physical Exam:   VS:  BP 134/66   Pulse (!) 57   Ht  (1.549 m)   Wt 129 lb 9.6 oz (58.8 kg)   SpO2 97%   BMI 24.49 kg/m    Wt Readings from Last 3 Encounters:  10/28/22 129 lb 9.6 oz (58.8 kg)  09/26/22 135 lb (61.2 kg)  09/05/22 133 lb 2 oz (60.4 kg)     GEN: Well nourished, well developed in no acute distress NECK: No JVD; No carotid bruits CARDIAC: RRR, bradycardic no murmurs, rubs, gallops RESPIRATORY:  Clear to auscultation without rales, wheezing or rhonchi  ABDOMEN: Soft, non-tender, non-distended EXTREMITIES: Trace pretibial edema; No deformity   ASSESSMENT AND PLAN:   Paroxysmal atrial fibrillation with sinus bradycardia noted on EKG today.  She was previously scheduled for an elective cardioversion that was canceled due to conversion after being loaded on amiodarone.  She previously stated metoprolol succinate 25 mg daily has been discontinued.  She is continued on amiodarone 200 mg once daily with reduced  dose from twice daily as she is maintaining sinus rhythm.  We did discuss previously Dr. Lady Gary try to wean her off of amiodarone and needed to get her 200 mg and then every other day if she converts back in atrial fibrillation.  Advised at this point she would likely have to stay on 200 mg daily.  She has been continued on diltiazem 240 mg daily.  She is also continued on apixaban for CHA2DS2-VASc score of at least 3 for stroke prophylaxis.  Repeat TSH level has been ordered.  Hyperlipidemia with LDL of 25 on 09/20/2022.  She  was continued on 5 mg of rosuvastatin at bedtime.  This continues to be monitored by her PCP.  Sinus bradycardia noted on EKG today with rate of 57.  Patient is asymptomatic.  Prior to discontinuation of metoprolol heart rate was in the 40s.  She is continued on diltiazem 240 mg daily.  Will continue to monitor with surveillance EKGs.  Lower extremity edema wishes 20 mg of furosemide as needed for swelling.  She has been on diltiazem for quite some time but has not had any increased swelling with taking calcium channel blockers.  She has been encouraged to continue to increase her extremities and limit her sodium intake.  Disposition patient return to clinic to see MD/APP in 3 months or sooner if needed.        Signed, Geneva Pallas, NP

## 2022-10-28 ENCOUNTER — Encounter: Payer: Self-pay | Admitting: Cardiology

## 2022-10-28 ENCOUNTER — Ambulatory Visit: Payer: Medicare PPO | Attending: Cardiology | Admitting: Cardiology

## 2022-10-28 VITALS — BP 134/66 | HR 57 | Ht 61.0 in | Wt 129.6 lb

## 2022-10-28 DIAGNOSIS — I480 Paroxysmal atrial fibrillation: Secondary | ICD-10-CM | POA: Diagnosis not present

## 2022-10-28 DIAGNOSIS — R001 Bradycardia, unspecified: Secondary | ICD-10-CM | POA: Diagnosis not present

## 2022-10-28 DIAGNOSIS — M7989 Other specified soft tissue disorders: Secondary | ICD-10-CM | POA: Diagnosis not present

## 2022-10-28 DIAGNOSIS — E782 Mixed hyperlipidemia: Secondary | ICD-10-CM | POA: Diagnosis not present

## 2022-10-28 MED ORDER — AMIODARONE HCL 200 MG PO TABS: 200.0000 mg | ORAL_TABLET | Freq: Every day | ORAL | 0 refills | Status: DC

## 2022-10-28 NOTE — Patient Instructions (Signed)
Medication Instructions:   STOP Metoprolol Succinate.   DECREASE Amiodarone 200 mg tablet by mouth daily.  *If you need a refill on your cardiac medications before your next appointment, please call your pharmacy*   Lab Work:  Your physician recommends that you return for lab work in 1 month.  TSH  - Please go to the [facility]. You will check in at the front desk to the right as you walk into the atrium. Valet Parking is offered if needed. - No appointment needed. You may go any day between 7 am and 6 pm.   If you have labs (blood work) drawn today and your tests are completely normal, you will receive your results only by: MyChart Message (if you have MyChart) OR A paper copy in the mail If you have any lab test that is abnormal or we need to change your treatment, we will call you to review the results.   Testing/Procedures:  No testing ordered today   Follow-Up: At [facility], you and your health needs are our priority.  As part of our continuing mission to provide you with exceptional heart care, we have created designated Provider Care Teams.  These Care Teams include your primary Cardiologist (physician) and Advanced Practice Providers (APPs -  Physician Assistants and Nurse Practitioners) who all work together to provide you with the care you need, when you need it.  We recommend signing up for the patient portal called "MyChart".  Sign up information is provided on this After Visit Summary.  MyChart is used to connect with patients for Virtual Visits (Telemedicine).  Patients are able to view lab/test results, encounter notes, upcoming appointments, etc.  Non-urgent messages can be sent to your provider as well.   To learn more about what you can do with MyChart, go to [email or web address].    Your next appointment:   3 month(s)  Provider:   You may see or one of the following Advanced Practice Providers on your designated Care Team:    Nicolasa Ducking, NP Eula Listen, PA-C Cadence Fransico Michael, PA-C Charlsie Quest, NP

## 2022-10-31 NOTE — Addendum Note (Signed)
Addended by: Margrett Rud on: 10/31/2022 01:26 PM   Modules accepted: Orders

## 2022-11-30 ENCOUNTER — Other Ambulatory Visit
Admission: RE | Admit: 2022-11-30 | Discharge: 2022-11-30 | Disposition: A | Discharge status: Home / Self Care | Payer: Medicare PPO | Attending: Cardiology | Admitting: Cardiology

## 2022-11-30 DIAGNOSIS — E782 Mixed hyperlipidemia: Secondary | ICD-10-CM | POA: Diagnosis present

## 2022-11-30 DIAGNOSIS — I480 Paroxysmal atrial fibrillation: Secondary | ICD-10-CM | POA: Diagnosis present

## 2022-11-30 LAB — TSH: TSH: 1.838 u[IU]/mL (ref 0.350–4.500)

## 2022-11-30 NOTE — Progress Notes (Signed)
Thyroid level remains stable.

## 2023-01-22 ENCOUNTER — Ambulatory Visit
Admission: EM | Admit: 2023-01-22 | Discharge: 2023-01-22 | Disposition: A | Discharge status: Home / Self Care | Payer: Medicare PPO | Attending: Urgent Care | Admitting: Urgent Care

## 2023-01-22 ENCOUNTER — Ambulatory Visit (INDEPENDENT_AMBULATORY_CARE_PROVIDER_SITE_OTHER): Payer: Medicare PPO

## 2023-01-22 DIAGNOSIS — Y92009 Unspecified place in unspecified non-institutional (private) residence as the place of occurrence of the external cause: Secondary | ICD-10-CM | POA: Diagnosis not present

## 2023-01-22 DIAGNOSIS — M25572 Pain in left ankle and joints of left foot: Secondary | ICD-10-CM | POA: Diagnosis not present

## 2023-01-22 DIAGNOSIS — W19XXXA Unspecified fall, initial encounter: Secondary | ICD-10-CM

## 2023-01-22 NOTE — Discharge Instructions (Addendum)
Your x-ray was negative for a fracture.  We presume a soft tissue injury.  We are recommending treatment with:  Rest-avoid ambulating or otherwise putting weight on the injured ankle.  Ice-cold therapy.  Apply a cold pack or ice filled plastic bag wrapped in a moist towel.  15 minutes on then 15 minutes off.  Compression-when you are not using cold therapy, use a compression brace to encourage resolution of the swelling and to provide some stability while you ambulate.  Elevation-keep your ankle and leg elevated above the level of your heart when you are not ambulating.  Try to avoid ambulating as much as possible.  If your symptoms are not improving in 1 week, recommend evaluation by an orthopedic provider to assess for a more serious soft tissue injury.  I have provided contact information for [facility], a local orthopedic urgent care which does not require referral.  Alternatively, you can ask your primary care provider for a referral to a different orthopedic provider.

## 2023-01-22 NOTE — ED Provider Notes (Signed)
[facility]    CSN: [id number] Arrival date & time: 01/22/23  1022      History   Chief Complaint No chief complaint on file.   HPI Natalie Torres is a 82 y.o. female.   HPI  Presents to UC with pain and swelling in her left foot and ankle.  She endorses a fall at home yesterday.  She endorses minimal weightbearing both immediately after the fall as well as currently.  Endorses significant pain.  Past Medical History:  Diagnosis Date   Anxiety    Arthritis    Cancer (HCC)    lymphoma   Complication of anesthesia    slow to wake   HLD (hyperlipidemia)    HOH (hard of hearing)    aids   Insomnia    Mechanical back pain    Neurocardiogenic syncope    Osteoarthritis    Osteopenia    Postmenopausal status    Recurrent UTI    Scoliosis    Sleep apnea    Sleep apnea     Patient Active Problem List   Diagnosis Date Noted   Hearing loss 12/18/2018   Hyperlipidemia 12/18/2018   Insomnia 12/18/2018   Mechanical back pain 12/18/2018   Neurocardiogenic syncope 12/18/2018   Osteoarthritis 12/18/2018   Osteopenia 12/18/2018   Postmenopausal status 12/18/2018   Scoliosis 12/18/2018   Sleep apnea 12/18/2018   Esophageal spasm 03/06/2018   Paroxysmal A-fib (HCC) 08/08/2017   Hiatal hernia 01/09/2017   Night sweats 03/09/2016   Recurrent UTI 10/21/2015   Left sided abdominal pain 10/21/2015   Atrophic vaginitis 10/21/2015   History of recurrent UTIs 04/08/2015   Multinodular goiter 04/03/2015   Thyroid nodule 04/03/2015   Gallstones 06/16/2014   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   Chronic cystitis 06/04/2012   Lymphoma (HCC) 12/09/2011    Past Surgical History:  Procedure Laterality Date   ABDOMINAL HYSTERECTOMY     BLADDER SUSPENSION     CARDIOVERSION N/A 10/06/2022   Procedure: CARDIOVERSION;  Surgeon: Antonieta Iba, MD;  Location: [facility] ORS;  Service: Cardiovascular;  Laterality: N/A;   CATARACT EXTRACTION W/PHACO Right 12/02/2014    Procedure: CATARACT EXTRACTION PHACO AND INTRAOCULAR LENS PLACEMENT (IOC);  Surgeon: Galen Manila, MD;  Location: [facility] ORS;  Service: Ophthalmology;  Laterality: Right;  [place]  00:38 AP% 41.5 CDE 7.22   CATARACT EXTRACTION W/PHACO Left 12/16/2014   Procedure: CATARACT EXTRACTION PHACO AND INTRAOCULAR LENS PLACEMENT (IOC);  Surgeon: Galen Manila, MD;  Location: [facility] ORS;  Service: Ophthalmology;  Laterality: Left;  [place] 00:41 AP% 21.4 CDE 8.74   CHOLECYSTECTOMY     EYE SURGERY     CATARACT   TONSILLECTOMY      OB History   No obstetric history on file.      Home Medications    Prior to Admission medications   Medication Sig Start Date End Date Taking? Authorizing Provider  acetaminophen (TYLENOL) 500 MG tablet Take 500 mg by mouth. 10/11/11   [provider]  amiodarone (PACERONE) 200 MG tablet Take 1 tablet (200 mg total) by mouth daily. 10/28/22   Charlsie Quest, NP  apixaban (ELIQUIS) 5 MG TABS tablet Take 1 tablet (5 mg total) by mouth 2 (two) times daily. 09/05/22   Antonieta Iba, MD  Cholecalciferol 125 MCG (5000 UT) TABS Take by mouth daily.    [provider]  diltiazem (CARTIA XT) 240 MG 24 hr capsule Take 1 capsule (240 mg total) by mouth daily. 09/05/22  Antonieta Iba, MD  Estradiol 10 MCG TABS vaginal tablet Place 1 tablet vaginally 2 (two) times a week.    [provider]  furosemide (LASIX) 20 MG tablet Take 1 tablet (20 mg total) by mouth as needed. 09/26/22 12/25/22  Antonieta Iba, MD  Lactobacillus Rhamnosus, GG, (CULTURELLE) CAPS Take by mouth.    [provider]  meclizine (ANTIVERT) 25 MG tablet Take 25 mg by mouth every 8 (eight) hours as needed. 04/21/22   [provider]  methenamine (HIPREX) 1 g tablet TAKE 1 TABLET (1 G TOTAL) BY MOUTH 2 TIMES DAILY WITH MEALS. 10/11/18   [provider]  methocarbamol (ROBAXIN) 500 MG tablet Take 500-1,000 mg by mouth at bedtime as needed. 05/13/22   [provider]  mirabegron ER (MYRBETRIQ) 25 MG TB24 tablet Take 1 tablet by mouth daily. Patient not taking: Reported on 09/05/2022 02/15/22   [provider]  mirtazapine (REMERON) 15 MG tablet Take 15 mg by mouth at bedtime.    [provider]  omeprazole (PRILOSEC) 40 MG capsule Take by mouth. 09/20/18 09/26/22  [provider]  potassium chloride (KLOR-CON) 10 MEQ tablet Take 1 tablet (10 mEq total) by mouth as needed (With the Lasix when needed for swelling). 09/26/22 12/25/22  Antonieta Iba, MD  rosuvastatin (CRESTOR) 5 MG tablet Take 1 tablet (5 mg total) by mouth at bedtime. 09/05/22   Antonieta Iba, MD  tiZANidine (ZANAFLEX) 2 MG tablet 1/2-1 tid prn Patient not taking: Reported on 09/05/2022 11/01/17   [provider]  traMADol (ULTRAM) 50 MG tablet Take 25 mg by mouth 3 (three) times daily.    [provider]    Family History Family History  Problem Relation Age of Onset   Arrhythmia Sister        A-Fib   Kidney disease Neg Hx    Bladder Cancer Neg Hx    Breast cancer Neg Hx    Kidney cancer Neg Hx     Social History Social History   Tobacco Use   Smoking status: Former    Packs/day: 0.50    Years: 25.00    Additional pack years: 0.00    Total pack years: 12.50    Types: Cigarettes    Quit date: 07/19/1989    Years since quitting: 33.5   Smokeless tobacco: Never   Tobacco comments:    quit 25 = years  Vaping Use   Vaping Use: Never used  Substance Use Topics   Alcohol use: Yes    Alcohol/week: 0.0 standard drinks of alcohol    Comment: occ   Drug use: No     Allergies   Hydrocodone   Review of Systems Review of Systems   Physical Exam Triage Vital Signs ED Triage Vitals  Enc Vitals Group     BP      Pulse      Resp      Temp      Temp src      SpO2      Weight      Height      Head Circumference      Peak Flow      Pain Score      Pain Loc      Pain Edu?      Excl. in GC?    No data  found.  Updated Vital Signs There were no vitals taken for this visit.  Visual Acuity Right Eye Distance:  Left Eye Distance:   Bilateral Distance:    Right Eye Near:   Left Eye Near:    Bilateral Near:     Physical Exam Vitals reviewed.  Constitutional:      Appearance: Normal appearance.  Musculoskeletal:     Left ankle: Tenderness present over the lateral malleolus.     Left Achilles Tendon: Normal.       Feet:  Skin:    General: Skin is warm and dry.  Neurological:     General: No focal deficit present.     Mental Status: She is alert and oriented to person, place, and time.  Psychiatric:        Mood and Affect: Mood normal.        Behavior: Behavior normal.     UC Treatments / Results  Labs (all labs ordered are listed, but only abnormal results are displayed) Labs Reviewed - No data to display  EKG   Radiology No results found.  Procedures Procedures (including critical care time)  Medications Ordered in UC Medications - No data to display  Initial Impression / Assessment and Plan / UC Course  I have reviewed the triage vital signs and the nursing notes.  Pertinent labs & imaging results that were available during my care of the patient were reviewed by me and considered in my medical decision making (see chart for details).   Natalie Torres is a 82 y.o. female presenting with L ankle pain. Patient is afebrile without recent antipyretics, satting well on room air. Overall is well appearing, well hydrated, without respiratory distress. Pulmonary exam is unremarkable.  Lungs CTAB without wheezing, rhonchi, rales. RRR without murmurs, rubs, gallops.  Tenderness to palpation, swelling, bruising around the left lateral malleolus.  Reviewed relevant chart history.   Satisfies [place] ankle rules and x-ray will be ordered to rule out fracture.  IMPRESSION: Soft tissue swelling over the lateral malleolus. No acute fracture or dislocation.  Presumed  soft tissue injury and will recommend RICE treatment.  Instructions were given to patient.  Counseled patient on potential for adverse effects with medications prescribed/recommended today, ER and return-to-clinic precautions discussed, patient verbalized understanding and agreement with care plan.  Final Clinical Impressions(s) / UC Diagnoses   Final diagnoses:  None   Discharge Instructions   None    ED Prescriptions   None    PDMP not reviewed this encounter.   Charma Igo, [place] 01/22/23 1155

## 2023-01-22 NOTE — ED Triage Notes (Signed)
Patient states yesterday she had a fall at home on carpet, she fell on her right side. The patient states she has pain to left foot/ ankle from the way the area bent when she fell. There is swelling the the left foot/ankle.   Home interventions: tylenol

## 2023-02-14 ENCOUNTER — Ambulatory Visit: Payer: Medicare PPO | Admitting: Cardiology

## 2023-03-22 ENCOUNTER — Ambulatory Visit: Payer: Medicare PPO | Attending: Cardiology | Admitting: Cardiology

## 2023-03-22 ENCOUNTER — Encounter: Payer: Self-pay | Admitting: Cardiology

## 2023-03-22 VITALS — BP 124/70 | HR 64 | Ht 61.0 in | Wt 129.6 lb

## 2023-03-22 DIAGNOSIS — R600 Localized edema: Secondary | ICD-10-CM

## 2023-03-22 DIAGNOSIS — I480 Paroxysmal atrial fibrillation: Secondary | ICD-10-CM | POA: Diagnosis not present

## 2023-03-22 DIAGNOSIS — E782 Mixed hyperlipidemia: Secondary | ICD-10-CM | POA: Diagnosis not present

## 2023-03-22 DIAGNOSIS — R7303 Prediabetes: Secondary | ICD-10-CM | POA: Diagnosis not present

## 2023-03-22 NOTE — Progress Notes (Signed)
Cardiology Office Note:  .   Date:  03/22/2023  ID:  Natalie Torres, DOB 1941/06/01, MRN [id number] PCP: Marguarite Arbour, MD  [facility] Providers Cardiologist:  None    History of Present Illness: .   Natalie Torres is a 82 y.o. female with a past medical history of paroxysmal atrial fibrillation, mixed hyperlipidemia, neurocardiogenic syncope, follicular lymphoma, sleep apnea, who is here today for follow-up.  Echocardiogram completed 11/22 demonstrated LVEF greater than 55%, mild MR and TR.  Continued on amiodarone and apixaban and she was scheduled for an elective cardioversion.  She presented to her appointment for scheduled cardioversion EKG was performed she was noted to be in sinus.  Cardioversion was canceled and she was scheduled for follow-up.  She was last seen in clinic/12/24.  At that time she was doing fairly well.  She had noted some dizziness and was requesting to decrease her amiodarone from twice daily dosing to daily dosing.  She also previously been taking Toprol-XL which due to side effects have been discontinued.  She was sent for blood work at that time and her amiodarone was decreased to daily.  No further testing was completed at that time  She returns to clinic today accompanied by her husband stating overall she does feel well today.  She denies any chest pain, shortness of breath, lightheadedness or dizziness.  She has had one spell of palpitations and some occasional swelling to his bilateral lower extremities since last time she was seen.  She was evaluated in urgent care on 01/22/2023 for a fall and is noted some swelling to the ankle since her fall.  She states she also had occasional bouts of being unable to sleep at night.  Most recently she took one of her husbands gabapentin to help her sleep but she stated it took her probably a day to a day and a half to get back to her normal self after taking the medication.  ROS: 10 point review of systems has been  reviewed and is considered negative with exception what is been listed in the HPI  Studies Reviewed: Marland Kitchen   EKG Interpretation Date/Time:  Wednesday March 22 2023 13:32:59 EDT Ventricular Rate:  64 PR Interval:  162 QRS Duration:  76 QT Interval:  354 QTC Calculation: 365 R Axis:   17  Text Interpretation: Normal sinus rhythm Low voltage QRS Nonspecific T wave abnormality When compared with ECG of 06-Oct-2022 07:51, PREVIOUS ECG IS PRESENT Confirmed by Charlsie Quest ([id number]) on 03/22/2023 1:39:27 PM    TTE 05/26/21 from outside hospital INTERPRETATION  NORMAL LEFT VENTRICULAR SYSTOLIC FUNCTION  NORMAL RIGHT VENTRICULAR SYSTOLIC FUNCTION  NO VALVULAR STENOSIS  MILD MR, TR, PR  EF >55%  Closest EF: >55% (Estimated)  Mitral: MILD MR  Tricuspid: MILD TR  Risk Assessment/Calculations:    CHA2DS2-VASc Score = 3   This indicates a 3.2% annual risk of stroke. The patient's score is based upon: CHF History: 0 HTN History: 0 Diabetes History: 0 Stroke History: 0 Vascular Disease History: 0 Age Score: 2 Gender Score: 1            Physical Exam:   VS:  BP 124/70 (BP Location: Left Arm, Patient Position: Sitting, Cuff Size: Normal)   Pulse 64   Ht 5\' 1"  (1.549 m)   Wt 129 lb 9.6 oz (58.8 kg)   SpO2 98%   BMI 24.49 kg/m    Wt Readings from Last 3 Encounters:  03/22/23 129 lb 9.6  oz (58.8 kg)  10/28/22 129 lb 9.6 oz (58.8 kg)  09/26/22 135 lb (61.2 kg)    GEN: Well nourished, well developed in no acute distress NECK: No JVD; No carotid bruits CARDIAC: RRR, no murmurs, rubs, gallops RESPIRATORY:  Clear to auscultation without rales, wheezing or rhonchi  ABDOMEN: Soft, non-tender, non-distended EXTREMITIES:  No edema; No deformity   ASSESSMENT AND PLAN: .   Paroxysmal atrial fibrillation with sinus rhythm noted on EKG today.  At her previous visit amiodarone was decreased from 200 mg twice daily to 200 mg daily.  TSH was checked and was found to be normal.  She will to try  to wean off of the amiodarone again as in the past that she had tried to wean off and went back into atrial fibrillation.  This time she is going to cut her 200 mg tablets in half with a In the morning and half in the afternoon and then after several weeks dropped to taking half a tablet daily.  So she will be continued on amiodarone 100 mg daily at the end of her weight, diltiazem 240 mg daily, and apixaban 5 mg twice daily for CHA2DS2-VASc score of at least 3 for stroke prophylaxis.  Hyperlipidemia with an LDL of 25 on 09/20/2022 she is continued on 5 mg rosuvastatin at bedtime.  This continues to be monitored by her PCP  Occasional lower extremity edema which she has been continued on furosemide 20 mg and as needed for swelling.  She has been on diltiazem for quite some time and this has not increased swelling, taking calcium channel blockers.  She states she recently did fall and was evaluated in urgent care which is likely exacerbating some of her swelling.  She has been encouraged to elevate her extremities and limit her sodium intake as well.  Prediabetes with a hemoglobin A1c last 6.0.  Managed by her PCP encouraged to increase activity and dietary changes.       Dispo: Patient return to clinic to see MD/APP in 3 to 4 months or sooner if needed to reevaluate symptoms  Signed, Shaquelle Hernon, NP

## 2023-03-22 NOTE — Patient Instructions (Signed)
Medication Instructions:  Your physician recommends that you continue on your current medications as directed. Please refer to the Current Medication list given to you today.  *If you need a refill on your cardiac medications before your next appointment, please call your pharmacy*   Lab Work: none If you have labs (blood work) drawn today and your tests are completely normal, you will receive your results only by: MyChart Message (if you have MyChart) OR A paper copy in the mail If you have any lab test that is abnormal or we need to change your treatment, we will call you to review the results.   Testing/Procedures: none  Follow-Up: At [facility], you and your health needs are our priority.  As part of our continuing mission to provide you with exceptional heart care, we have created designated Provider Care Teams.  These Care Teams include your primary Cardiologist (physician) and Advanced Practice Providers (APPs -  Physician Assistants and Nurse Practitioners) who all work together to provide you with the care you need, when you need it.  We recommend signing up for the patient portal called "MyChart".  Sign up information is provided on this After Visit Summary.  MyChart is used to connect with patients for Virtual Visits (Telemedicine).  Patients are able to view lab/test results, encounter notes, upcoming appointments, etc.  Non-urgent messages can be sent to your provider as well.   To learn more about what you can do with MyChart, go to [email or web address].    Your next appointment:   3-4 month  Provider:   Charlsie Quest, NP

## 2023-03-31 ENCOUNTER — Other Ambulatory Visit: Payer: Self-pay | Admitting: Cardiology

## 2023-04-07 ENCOUNTER — Other Ambulatory Visit: Payer: Self-pay | Admitting: Internal Medicine

## 2023-04-07 DIAGNOSIS — Z1231 Encounter for screening mammogram for malignant neoplasm of breast: Secondary | ICD-10-CM

## 2023-06-21 ENCOUNTER — Encounter: Payer: Self-pay | Admitting: Cardiology

## 2023-06-21 ENCOUNTER — Ambulatory Visit: Payer: Medicare PPO | Attending: Cardiology | Admitting: Cardiology

## 2023-06-21 VITALS — BP 130/70 | HR 61 | Ht 60.0 in | Wt 134.2 lb

## 2023-06-21 DIAGNOSIS — E782 Mixed hyperlipidemia: Secondary | ICD-10-CM | POA: Diagnosis not present

## 2023-06-21 DIAGNOSIS — I480 Paroxysmal atrial fibrillation: Secondary | ICD-10-CM

## 2023-06-21 DIAGNOSIS — R600 Localized edema: Secondary | ICD-10-CM

## 2023-06-21 DIAGNOSIS — R7303 Prediabetes: Secondary | ICD-10-CM

## 2023-06-21 MED ORDER — DILTIAZEM HCL ER COATED BEADS 240 MG PO CP24: 240.0000 mg | ORAL_CAPSULE | Freq: Every day | ORAL | 3 refills | Status: DC

## 2023-06-21 NOTE — Progress Notes (Signed)
Cardiology Office Note:  .   Date:  06/21/2023  ID:  Natalie Torres, DOB [birth year], MRN [id number] PCP: Marguarite Arbour, MD  [facility] Providers Cardiologist:  None    History of Present Illness: .   Natalie Torres is a 82 y.o. female with past medical history of paroxysmal atrial fibrillation, mixed hyperlipidemia, neurocardiogenic syncope, follicular lymphoma, sleep apnea, who is here today for follow-up.   Echocardiogram completed 11/22 demonstrated LVEF greater than 55%, mild MR and TR. Continued on amiodarone and apixaban and she was scheduled for an elective cardioversion. She presented to her appointment for scheduled cardioversion EKG was performed she was noted to be in sinus. Cardioversion was canceled and she was scheduled for follow-up.    She was last seen in clinic 9//24 accompanied by her husband stating overall she was feeling well.  She denied any anginal or anginal equivalents.  She had 1 spell of palpitations some occasional swelling to her bilateral lower extremities.  She was evaluated in urgent care for fall and noted some swelling in her ankle since that time.  Amiodarone was weaned down as the last time she was weaned totally off of amiodarone unfortunately she had gone back into atrial fibrillation.  She returns to clinic today accompanied by her husband today. States that overall she has been doing well. Denies any chest pain, shortness of breath, or palpitations. She does have occasional swelling to her bilateral lower extremities. She has reduced her amiodarone to 100 mg daily without any continued episodes of atrial fibrillation for the last three months. She is requesting today to stop the amiodarone again as she has done int he past. She recently had labs completed by her PCP. Denies any bleeding with any no blood noted in her stool or urine.  She has been compliant with her current medication regimen.  Denies any hospitalizations or visits to the emergency  department.  ROS: 10 point review of system has been reviewed and considered negative with exception what is been listed in the HPI  Studies Reviewed: Marland Kitchen   EKG Interpretation Date/Time:  Wednesday June 21 2023 13:31:56 EST Ventricular Rate:  61 PR Interval:  164 QRS Duration:  76 QT Interval:  376 QTC Calculation: 378 R Axis:   22  Text Interpretation: Normal sinus rhythm Low voltage QRS Nonspecific T wave abnormality When compared with ECG of 22-Mar-2023 13:32, No significant change was found Confirmed by Charlsie Quest ([id number]) on 06/21/2023 1:38:42 PM    TTE 05/26/21 from outside hospital INTERPRETATION  NORMAL LEFT VENTRICULAR SYSTOLIC FUNCTION  NORMAL RIGHT VENTRICULAR SYSTOLIC FUNCTION  NO VALVULAR STENOSIS  MILD MR, TR, PR  EF >55%  Closest EF: >55% (Estimated)  Mitral: MILD MR  Tricuspid: MILD TR  Risk Assessment/Calculations:    CHA2DS2-VASc Score = 3   This indicates a 3.2% annual risk of stroke. The patient's score is based upon: CHF History: 0 HTN History: 0 Diabetes History: 0 Stroke History: 0 Vascular Disease History: 0 Age Score: 2 Gender Score: 1            Physical Exam:   VS:  BP 130/70 (BP Location: Left Arm, Patient Position: Sitting, Cuff Size: Normal)   Pulse 61   Ht 5' (1.524 m)   Wt 134 lb 3.2 oz (60.9 kg)   SpO2 98%   BMI 26.21 kg/m    Wt Readings from Last 3 Encounters:  06/21/23 134 lb 3.2 oz (60.9 kg)  03/22/23 129 lb 9.6 oz (  58.8 kg)  10/28/22 129 lb 9.6 oz (58.8 kg)    GEN: Well nourished, well developed in no acute distress NECK: No JVD; No carotid bruits CARDIAC: RRR, no murmurs, rubs, gallops RESPIRATORY:  Clear to auscultation without rales, wheezing or rhonchi  ABDOMEN: Soft, non-tender, non-distended EXTREMITIES: Trace pretibial edema; No deformity   ASSESSMENT AND PLAN: .   Paroxysmal atrial fibrillation with sinus rhythm noted on EKG today with a rate of 61.  She has been taking amiodarone 100 mg daily and would  like to wean off of primary medication if she has done in the past.   For the last 3 months she has had no further episodes of atrial fibrillation, palpitations, or other associated symptoms. It is reasonable to stop her amiodarone at this point.   She has been continued on diltiazem 240 mg daily and apixaban 5 mg twice daily for CHA2DS2-VASc score of at least 3 for stroke prophylaxis without any incidence of bleeding.  Hyperlipidemia with last LDL of 42.  She is continued on rosuvastatin 5 mg at bedtime.  This continues to be managed by her PCP.  Occasional lower extremity edema which she is continue furosemide 20 mg as needed for swelling.  Her diltiazem will likely has caused some of her swelling.  She has been advised to continue to wear her compression socks, elevate her extremities, and foot calf pumps.  Prediabetes with hemoglobin A1c 6.0.  Managed by her PCP and encouraged to increase her activity and maintain her dietary changes.       Dispo: Patient return to clinic to see MD/APP in about 6 months or sooner if needed for reevaluation of symptoms.  Signed, Yannick Steuber, NP

## 2023-06-21 NOTE — Patient Instructions (Addendum)
Medication Instructions:  - No changes *If you need a refill on your cardiac medications before your next appointment, please call your pharmacy*  Lab Work: - None ordered  Testing/Procedures: - None ordered  Follow-Up: At [facility], you and your health needs are our priority.  As part of our continuing mission to provide you with exceptional heart care, we have created designated Provider Care Teams.  These Care Teams include your primary Cardiologist (physician) and Advanced Practice Providers (APPs -  Physician Assistants and Nurse Practitioners) who all work together to provide you with the care you need, when you need it.  Your next appointment:   5 - 6 month(s)  Provider:   Charlsie Quest, NP    Other Instructions - Finish amiodarone (PACERONE) 200 MG tablet then stop

## 2023-06-27 NOTE — Telephone Encounter (Signed)
Does she need a refill on her medications?

## 2023-06-30 ENCOUNTER — Ambulatory Visit
Admission: RE | Admit: 2023-06-30 | Discharge: 2023-06-30 | Disposition: A | Discharge status: Home / Self Care | Payer: Medicare PPO | Source: Ambulatory Visit | Attending: Internal Medicine | Admitting: Internal Medicine

## 2023-06-30 DIAGNOSIS — Z1231 Encounter for screening mammogram for malignant neoplasm of breast: Secondary | ICD-10-CM | POA: Insufficient documentation

## 2023-09-07 ENCOUNTER — Other Ambulatory Visit: Payer: Self-pay | Admitting: Cardiovascular Disease

## 2023-09-14 ENCOUNTER — Other Ambulatory Visit: Payer: Self-pay | Admitting: Cardiology

## 2023-09-19 MED ORDER — AMIODARONE HCL 200 MG PO TABS: 100.0000 mg | ORAL_TABLET | Freq: Every day | ORAL | 1 refills | Status: DC

## 2023-09-19 NOTE — Telephone Encounter (Signed)
 OK to continue a 1/2 tablet (100 mg) daily. Thanks

## 2023-09-27 ENCOUNTER — Other Ambulatory Visit: Payer: Self-pay | Admitting: Cardiovascular Disease

## 2023-09-27 NOTE — Telephone Encounter (Signed)
 Prescription refill request for Eliquis received. Indication:afib Last office visit:12/24 Scr:1.16  10/24 Age: 83 Weight:60.9  kg  Prescription refilled

## 2023-11-15 ENCOUNTER — Other Ambulatory Visit: Payer: Self-pay | Admitting: Cardiovascular Disease

## 2023-12-28 ENCOUNTER — Other Ambulatory Visit: Payer: Self-pay | Admitting: Cardiology

## 2024-01-15 ENCOUNTER — Ambulatory Visit: Attending: Cardiology | Admitting: Cardiology

## 2024-01-15 ENCOUNTER — Encounter: Payer: Self-pay | Admitting: Cardiology

## 2024-01-15 VITALS — BP 110/70 | HR 62 | Ht 60.0 in | Wt 131.5 lb

## 2024-01-15 DIAGNOSIS — R7303 Prediabetes: Secondary | ICD-10-CM

## 2024-01-15 DIAGNOSIS — R600 Localized edema: Secondary | ICD-10-CM

## 2024-01-15 DIAGNOSIS — E782 Mixed hyperlipidemia: Secondary | ICD-10-CM | POA: Diagnosis not present

## 2024-01-15 DIAGNOSIS — I480 Paroxysmal atrial fibrillation: Secondary | ICD-10-CM

## 2024-01-15 MED ORDER — APIXABAN 5 MG PO TABS: 5.0000 mg | ORAL_TABLET | Freq: Two times a day (BID) | ORAL | 3 refills | Status: AC

## 2024-01-15 NOTE — Patient Instructions (Signed)
 Medication Instructions:  Your physician recommends that you continue on your current medications as directed. Please refer to the Current Medication list given to you today.   *If you need a refill on your cardiac medications before your next appointment, please call your pharmacy*  Lab Work: No labs ordered today  If you have labs (blood work) drawn today and your tests are completely normal, you will receive your results only by: MyChart Message (if you have MyChart) OR A paper copy in the mail If you have any lab test that is abnormal or we need to change your treatment, we will call you to review the results.  Testing/Procedures: No test ordered today   Follow-Up: At [facility], you and your health needs are our priority.  As part of our continuing mission to provide you with exceptional heart care, our providers are all part of one team.  This team includes your primary Cardiologist (physician) and Advanced Practice Providers or APPs (Physician Assistants and Nurse Practitioners) who all work together to provide you with the care you need, when you need it.  Your next appointment:   6 month(s)  Provider:   Ronald Cockayne, NP

## 2024-01-15 NOTE — Progress Notes (Signed)
 Cardiology Office Note   Date:  01/15/2024  ID:  Caera, Enwright 02-Aug-1940, MRN [id number] PCP: Auston Reyes BIRCH, MD  [facility] Providers Cardiologist:  None     History of Present Illness Natalie Torres is a 83 y.o. female with a past medical history of paroxysmal atrial fibrillation, mixed hyperlipidemia, neurocardiogenic syncope, follicular lymphoma, sleep apnea, who is here today for follow-up.   Echocardiogram completed 11/22 demonstrated LVEF greater than 55%, mild MR and TR. Continued on amiodarone  and apixaban  and she was scheduled for an elective cardioversion. She presented to her appointment for scheduled cardioversion EKG was performed she was noted to be in sinus. Cardioversion was canceled and she was scheduled for follow-up.   She was seen in clinic September 2024, overall she was feeling well.  She denies any anginal anginal equivalents.  She had had 1 spell of palpitations with some occasional swelling to her bilateral lower extremities.  She had been evaluated in urgent care status post mechanical fall and had some swelling in her ankle since that time.  Amiodarone  was weaned down as the last time she stated she was weaned totally off of amiodarone  she did come back in atrial fibrillation.   She was last seen in clinic 06/21/2023.  At that time she had overall been doing well.  Amiodarone  been reduced to 100 mg daily without any recurrent episodes of atrial fibrillation over the last 3 months.  She states she did recently have blood work done by her primary care provider as well.  There were no medication changes that were made at the time of further testing that was ordered.  She returns to clinic today stating overall she has been doing well from the cardiac perspective.  She is accompanied by her husband today.  They have just recently moved to [facility].  She denies any recurrence of her atrial fibrillation with reduced dose amiodarone .  Has been compliant with  her apixaban  and denies any bleeding with no blood noted in her urine or stool.  Recently had labs completed by her PCP with stable TSH.  States that she has been compliant with her current medication regimen without any adverse side effects.  Denies any hospitalizations or visits to the emergency department.  ROS: 10 point review of system has been reviewed and considered negative except ones are listed in the HPI  Studies Reviewed EKG Interpretation Date/Time:  Monday January 15 2024 11:04:03 EDT Ventricular Rate:  62 PR Interval:  184 QRS Duration:  72 QT Interval:  390 QTC Calculation: 395 R Axis:   22  Text Interpretation: Sinus rhythm with Premature atrial complexes in a pattern of bigeminy Low voltage QRS When compared with ECG of 21-Jun-2023 13:31, Premature atrial complexes are now Present Confirmed by Gerard Frederick ([id number]) on 01/15/2024 11:06:07 AM    TTE 05/26/21 from outside hospital INTERPRETATION  NORMAL LEFT VENTRICULAR SYSTOLIC FUNCTION  NORMAL RIGHT VENTRICULAR SYSTOLIC FUNCTION  NO VALVULAR STENOSIS  MILD MR, TR, PR  EF >55%  Closest EF: >55% (Estimated)  Mitral: MILD MR  Tricuspid: MILD TR  Risk Assessment/Calculations  CHA2DS2-VASc Score = 3   This indicates a 3.2% annual risk of stroke. The patient's score is based upon: CHF History: 0 HTN History: 0 Diabetes History: 0 Stroke History: 0 Vascular Disease History: 0 Age Score: 2 Gender Score: 1            Physical Exam VS:  BP 110/70 (BP Location: Left Arm, Patient Position:  Sitting, Cuff Size: Normal)   Pulse 62   Ht 5' (1.524 m)   Wt 131 lb 8 oz (59.6 kg)   SpO2 97%   BMI 25.68 kg/m        Wt Readings from Last 3 Encounters:  01/15/24 131 lb 8 oz (59.6 kg)  06/21/23 134 lb 3.2 oz (60.9 kg)  03/22/23 129 lb 9.6 oz (58.8 kg)    GEN: Well nourished, well developed in no acute distress NECK: No JVD; No carotid bruits CARDIAC: RRR, no murmurs, rubs, gallops RESPIRATORY:  Clear to auscultation  without rales, wheezing or rhonchi  ABDOMEN: Soft, non-tender, non-distended EXTREMITIES: Trace pretibial edema; No deformity   ASSESSMENT AND PLAN Paroxysmal atrial fibrillation which she has been maintaining sinus rhythm.  EKG today reveals sinus rhythm with a rate of 62 with PACs in a bigeminal pattern.  She is continued on amiodarone  100 mg daily with a stable TSH.  And apixaban  5 mg twice daily for CHA2DS2-VASc score of at least 3 for stroke prophylaxis.  She is also on diltiazem  240 mg once daily.  Mixed hyperlipidemia with last LDL 42.  She is continued on rosuvastatin  5 mg at bedtime.  Ongoing management per PCP.  Occasional lower extremity edema which she is continued on furosemide  20 mg as needed for swelling.  She states that she has taking the furosemide  at home once or twice every couple weeks.  Prediabetes with a hemoglobin A1c of 6.  Ongoing management per PCP.       Dispo: Patient to return to clinic to see MD/APP in 6 months or sooner if needed for reevaluation  Signed, Natacha Jepsen, NP

## 2024-02-16 ENCOUNTER — Other Ambulatory Visit: Payer: Self-pay | Admitting: Cardiovascular Disease

## 2024-02-19 ENCOUNTER — Other Ambulatory Visit: Payer: Self-pay | Admitting: Orthopedic Surgery

## 2024-02-19 DIAGNOSIS — M5416 Radiculopathy, lumbar region: Secondary | ICD-10-CM

## 2024-02-19 DIAGNOSIS — M4125 Other idiopathic scoliosis, thoracolumbar region: Secondary | ICD-10-CM

## 2024-02-27 ENCOUNTER — Ambulatory Visit
Admission: RE | Admit: 2024-02-27 | Discharge: 2024-02-27 | Disposition: A | Discharge status: Home / Self Care | Source: Ambulatory Visit | Attending: Orthopedic Surgery | Admitting: Orthopedic Surgery

## 2024-02-27 DIAGNOSIS — M5416 Radiculopathy, lumbar region: Secondary | ICD-10-CM | POA: Diagnosis present

## 2024-02-27 DIAGNOSIS — M4125 Other idiopathic scoliosis, thoracolumbar region: Secondary | ICD-10-CM | POA: Insufficient documentation

## 2024-03-12 NOTE — Telephone Encounter (Signed)
 Previously she was on metoprolol  succinate 25 mg daily along with diltiazem  240 mg daily.  It appears at some point metoprolol  was discontinued.  He can he has been dispenser as to why the metoprolol  was discontinued once that issue of her not being able to tolerate it.  Also if she did not tolerate recommend restarting metoprolol  succinate 25 mg daily and reducing diltiazem  from 240 mg daily to 180 mg daily to see if a reduction in the dose would help with swelling to her bilateral lower extremities.  She will need to continue with her furosemide  and conservative therapy of elevating her extremities, decrease in sodium intake, compression stockings.

## 2024-03-22 NOTE — Discharge Instructions (Signed)
 Instructions after Total Hip Replacement   Tanvi Gatling P. Angie Fava., M.D.    Dept. of Orthopaedics & Sports Medicine [facility] [address] [place], [place]  [address]  Phone: [phone]   Fax: [phone]        [email or web address]        DIET: Drink plenty of non-alcoholic fluids. Resume your normal diet. Include foods high in fiber.  ACTIVITY:  You may use crutches or a walker with weight-bearing as tolerated, unless instructed otherwise. You may be weaned off of the walker or crutches by your Physical Therapist.  Do NOT reach below the level of your knees or cross your legs until allowed.    Continue doing gentle exercises. Exercising will reduce the pain and swelling, increase motion, and prevent muscle weakness.   Please continue to use the TED compression stockings for 6 weeks. You may remove the stockings at night, but should reapply them in the morning. Do not drive or operate any equipment until instructed.  WOUND CARE:  Continue to use ice packs periodically to reduce pain and swelling. The initial dressing (Aquacel) can remain in place for 7 days (see separate instructions). Keep the incision clean and dry. You may bathe or shower after the staples are removed at the first office visit following surgery.  MEDICATIONS: You may resume your regular medications. Please take the pain medication as prescribed on the medication. Do not take pain medication on an empty stomach. Unless instructed otherwise, you should take an enteric-coated aspirin 81 mg. TWICE a day. (This along with elevation will help reduce the possibility of blood clots/phlebitis in your operated leg.) Use a stool softener (such as Senokot-S or Colace) daily and a laxative (such as Miralax or Dulcolax) as needed to prevent constipation.  Do not drive or drink alcoholic beverages when taking pain medications.  CALL THE OFFICE FOR: Temperature above 101 degrees Excessive bleeding or drainage  on the dressing. Excessive swelling, coldness, or paleness of the toes. Persistent nausea and vomiting.  FOLLOW-UP:  You should have an appointment to return to the office in 6 weeks after surgery. Arrangements have been made for continuation of Physical Therapy (either home therapy or outpatient therapy).     [facility] Department Directory         [email or web address]       [email or web address]          Cardiology  Appointments: [place] - [phone]  Endocrinology  Appointments: [place] [phone] [place] - [phone]  Gastroenterology  Appointments: [place] [phone] [place] - [phone]        General Surgery   Appointments: [facility]  Internal Medicine/Family Medicine  Appointments: [facility] [place] - [phone] [place] - [phone]  Metabolic and Weigh Loss Surgery  Appointments: [facility]        Neurology  Appointments: [place] [phone] [place] - [phone]  Neurosurgery  Appointments: [place]  Obstetrics & Gynecology  Appointments: [place] [phone] [place] - [phone]        Pediatrics  Appointments: [facility] [phone] [place] - [phone]  Physiatry  Appointments: [place] [phone]  Physical Therapy  Appointments: [place] - [phone]        Podiatry  Appointments: [place] [phone] [place] - [phone]  Pulmonology  Appointments: [place]  Rheumatology  Appointments: [place] [phone]        [place] Location: [facility]  [address] [place], [place]  [address]  [place]  Location: [facility]. [address] [place], [place]  [address]  [place] Location: [facility] [address] [place], [place]  [address]

## 2024-03-26 ENCOUNTER — Encounter
Admission: RE | Admit: 2024-03-26 | Discharge: 2024-03-26 | Disposition: A | Discharge status: Home / Self Care | Source: Ambulatory Visit | Attending: Orthopedic Surgery | Admitting: Orthopedic Surgery

## 2024-03-26 ENCOUNTER — Other Ambulatory Visit: Payer: Self-pay

## 2024-03-26 VITALS — BP 122/64 | HR 65 | Resp 16 | Ht 60.0 in | Wt 135.0 lb

## 2024-03-26 DIAGNOSIS — C8290 Follicular lymphoma, unspecified, unspecified site: Secondary | ICD-10-CM | POA: Diagnosis not present

## 2024-03-26 DIAGNOSIS — M1611 Unilateral primary osteoarthritis, right hip: Secondary | ICD-10-CM | POA: Diagnosis not present

## 2024-03-26 DIAGNOSIS — Z01818 Encounter for other preprocedural examination: Secondary | ICD-10-CM | POA: Insufficient documentation

## 2024-03-26 DIAGNOSIS — Z01812 Encounter for preprocedural laboratory examination: Secondary | ICD-10-CM

## 2024-03-26 HISTORY — DX: Personal history of other diseases of the digestive system: Z87.19

## 2024-03-26 HISTORY — DX: Nausea with vomiting, unspecified: R11.2

## 2024-03-26 HISTORY — DX: Other specified postprocedural states: Z98.890

## 2024-03-26 HISTORY — DX: Gastro-esophageal reflux disease without esophagitis: K21.9

## 2024-03-26 LAB — COMPREHENSIVE METABOLIC PANEL WITH GFR
ALT: 15 U/L (ref 0–44)
AST: 17 U/L (ref 15–41)
Albumin: 3.7 g/dL (ref 3.5–5.0)
Alkaline Phosphatase: 75 U/L (ref 38–126)
Anion gap: 10 (ref 5–15)
BUN: 22 mg/dL (ref 8–23)
CO2: 26 mmol/L (ref 22–32)
Calcium: 8.9 mg/dL (ref 8.9–10.3)
Chloride: 105 mmol/L (ref 98–111)
Creatinine, Ser: 1.01 mg/dL — ABNORMAL HIGH (ref 0.44–1.00)
GFR, Estimated: 55 mL/min — ABNORMAL LOW (ref >60)
Glucose, Bld: 103 mg/dL — ABNORMAL HIGH (ref 70–99)
Potassium: 3.7 mmol/L (ref 3.5–5.1)
Sodium: 141 mmol/L (ref 135–145)
Total Bilirubin: 0.5 mg/dL (ref 0.0–1.2)
Total Protein: 6.4 g/dL — ABNORMAL LOW (ref 6.5–8.1)

## 2024-03-26 LAB — SEDIMENTATION RATE: Sed Rate: 14 mm/h (ref 0–30)

## 2024-03-26 LAB — SURGICAL PCR SCREEN
MRSA, PCR: NEGATIVE
Staphylococcus aureus: POSITIVE — AB

## 2024-03-26 LAB — CBC
HCT: 39.7 % (ref 36.0–46.0)
Hemoglobin: 12.5 g/dL (ref 12.0–15.0)
MCH: 29.1 pg (ref 26.0–34.0)
MCHC: 31.5 g/dL (ref 30.0–36.0)
MCV: 92.3 fL (ref 80.0–100.0)
Platelets: 229 K/uL (ref 150–400)
RBC: 4.3 MIL/uL (ref 3.87–5.11)
RDW: 13.1 % (ref 11.5–15.5)
WBC: 10.4 K/uL (ref 4.0–10.5)
nRBC: 0 % (ref 0.0–0.2)

## 2024-03-26 LAB — URINALYSIS, ROUTINE W REFLEX MICROSCOPIC
Bilirubin Urine: NEGATIVE
Glucose, UA: NEGATIVE mg/dL
Hgb urine dipstick: NEGATIVE
Ketones, ur: NEGATIVE mg/dL
Leukocytes,Ua: NEGATIVE
Nitrite: NEGATIVE
Protein, ur: NEGATIVE mg/dL
Specific Gravity, Urine: 1.019 (ref 1.005–1.030)
pH: 5 (ref 5.0–8.0)

## 2024-03-26 LAB — C-REACTIVE PROTEIN: CRP: 0.8 mg/dL (ref <1.0)

## 2024-03-26 LAB — TYPE AND SCREEN
ABO/RH(D): B NEG
Antibody Screen: NEGATIVE

## 2024-03-26 NOTE — Patient Instructions (Addendum)
 Your procedure is scheduled on: Wednesday 04/03/24 Report to the Registration Desk on the 1st floor of the [facility]. To find out your arrival time, please call [phone] between 1PM - 3PM on: Tuesday 04/02/24 If your arrival time is 6:00 am, do not arrive before that time as the [facility] entrance doors do not open until 6:00 am.  REMEMBER: Instructions that are not followed completely may result in serious medical risk, up to and including death; or upon the discretion of your surgeon and anesthesiologist your surgery may need to be rescheduled.  Do not eat food after midnight the night before surgery.  No gum chewing or hard candies.  You may however, drink CLEAR liquids up to 2 hours before you are scheduled to arrive for your surgery. Do not drink anything within 2 hours of your scheduled arrival time.  Clear liquids include: - water  - apple juice without pulp - gatorade (not RED colors) - black coffee or tea (Do NOT add milk or creamers to the coffee or tea) Do NOT drink anything that is not on this list.  In addition, your doctor has ordered for you to drink the provided:  Gatorade G2 (pt preferred) Drinking this carbohydrate drink up to two hours before surgery helps to reduce insulin resistance and improve patient outcomes. Please complete drinking 2 hours before scheduled arrival time.  One week prior to surgery: Stop Anti-inflammatories (NSAIDS) such as Advil, Aleve, Ibuprofen, Motrin, Naproxen, Naprosyn and Aspirin based products such as Excedrin, Goody's Powder, BC Powder.  You may however, continue to take Tylenol if needed for pain up until the day of surgery.  Stop ANY OVER THE COUNTER supplements and vitamins until after surgery. (D3)  **Follow recommendations regarding stopping blood thinners.** LAST DOSE OF ELIQUIS  WILL BE SATURDAY 03/30/24  Continue taking all of your other prescription medications up until the day of surgery.  ON THE DAY OF SURGERY  ONLY TAKE THESE MEDICATIONS WITH SIPS OF WATER:  amiodarone  (PACERONE ) 100 MG tablet (1/2 TAB) diltiazem  (CARDIZEM  CD) 240 MG 24 hr capsule  methenamine (HIPREX) 1 g tablet  omeprazole (PRILOSEC) 40 MG capsule  traMADol (ULTRAM) 25 MG tablet IF NEEDED  No Alcohol for 24 hours before or after surgery.  No Smoking including e-cigarettes for 24 hours before surgery.  No chewable tobacco products for at least 6 hours before surgery.  No nicotine patches on the day of surgery.  Do not use any recreational drugs for at least a week (preferably 2 weeks) before your surgery.  Please be advised that the combination of cocaine and anesthesia may have negative outcomes, up to and including death. If you test positive for cocaine, your surgery will be cancelled.  On the morning of surgery brush your teeth with toothpaste and water, you may rinse your mouth with mouthwash if you wish. Do not swallow any toothpaste or mouthwash.  Use CHG Soap or wipes as directed on instruction sheet.  Do not shave body hair from the neck down 48 hours before surgery.  Do not wear lotions, powders, or perfumes on the day of surgery  Wear comfortable clothing (specific to your surgery type) to the hospital  Do not wear jewelry, make-up, hairpins, clips or nail polish.  For welded (permanent) jewelry: bracelets, anklets, waist bands, etc.  Please have this removed prior to surgery.  If it is not removed, there is a chance that hospital personnel will need to cut it off on the day of surgery.  Contact lenses, hearing aids and dentures may not be worn into surgery. BRING A CASE FOR YOUR HEARING AIDS AND GLASSES  Do not bring valuables to the hospital. [facility] is not responsible for any missing/lost belongings or valuables.   Notify your doctor if there is any change in your medical condition (cold, fever, infection).  After surgery, you can help prevent lung complications by doing breathing exercises.   Take deep breaths and cough every 1-2 hours. Your doctor may order a device called an Incentive Spirometer to help you take deep breaths.  If you are being admitted to the hospital overnight, leave your suitcase in the car. After surgery it may be brought to your room.  In case of increased patient census, it may be necessary for you, the patient, to continue your postoperative care in the Same Day Surgery department.  Please call the Pre-admissions Testing Dept. at [phone] if you have any questions about these instructions.  Surgery Visitation Policy:  Patients having surgery or a procedure may have two visitors.  Children under the age of 28 must have an adult with them who is not the patient.  Inpatient Visitation:    Visiting hours are 7 a.m. to 8 p.m. Up to four visitors are allowed at one time in a patient room. The visitors may rotate out with other people during the day.  One visitor age 95 or older may stay with the patient overnight and must be in the room by 8 p.m.   Merchandiser, retail to address health-related social needs:  [email or web address]     Pre-operative 5 CHG Bath Instructions   You can play a key role in reducing the risk of infection after surgery. Your skin needs to be as free of germs as possible. You can reduce the number of germs on your skin by washing with CHG (chlorhexidine gluconate) soap before surgery. CHG is an antiseptic soap that kills germs and continues to kill germs even after washing.   DO NOT use if you have an allergy to chlorhexidine/CHG or antibacterial soaps. If your skin becomes reddened or irritated, stop using the CHG and notify one of our RNs at [phone].   Please shower with the CHG soap starting 4 days before surgery using the following schedule:   Saturday 03/30/24 - Wednesday 04/03/24    Please keep in mind the following:  DO NOT shave, including legs and underarms, starting the day of your  first shower.   You may shave your face at any point before/day of surgery.  Place clean sheets on your bed the day you start using CHG soap. Use a clean washcloth (not used since being washed) for each shower. DO NOT sleep with pets once you start using the CHG.   CHG Shower Instructions:  If you choose to wash your hair and private area, wash first with your normal shampoo/soap.  After you use shampoo/soap, rinse your hair and body thoroughly to remove shampoo/soap residue.  Turn the water OFF and apply about 3 tablespoons (45 ml) of CHG soap to a CLEAN washcloth.  Apply CHG soap ONLY FROM YOUR NECK DOWN TO YOUR TOES (washing for 3-5 minutes)  DO NOT use CHG soap on face, private areas, open wounds, or sores.  Pay special attention to the area where your surgery is being performed.  If you are having back surgery, having someone wash your back for you may be helpful. Wait 2 minutes after CHG soap is applied, then you  may rinse off the CHG soap.  Pat dry with a clean towel  Put on clean clothes/pajamas   If you choose to wear lotion, please use ONLY the CHG-compatible lotions on the back of this paper.     Additional instructions for the day of surgery: DO NOT APPLY any lotions, deodorants, cologne, or perfumes.   Put on clean/comfortable clothes.  Brush your teeth.  Ask your nurse before applying any prescription medications to the skin.      CHG Compatible Lotions   Aveeno Moisturizing lotion  Cetaphil Moisturizing Cream  Cetaphil Moisturizing Lotion  Clairol Herbal Essence Moisturizing Lotion, Dry Skin  Clairol Herbal Essence Moisturizing Lotion, Extra Dry Skin  Clairol Herbal Essence Moisturizing Lotion, Normal Skin  Curel Age Defying Therapeutic Moisturizing Lotion with Alpha Hydroxy  Curel Extreme Care Body Lotion  Curel Soothing Hands Moisturizing Hand Lotion  Curel Therapeutic Moisturizing Cream, Fragrance-Free  Curel Therapeutic Moisturizing Lotion, Fragrance-Free   Curel Therapeutic Moisturizing Lotion, Original Formula  Eucerin Daily Replenishing Lotion  Eucerin Dry Skin Therapy Plus Alpha Hydroxy Crme  Eucerin Dry Skin Therapy Plus Alpha Hydroxy Lotion  Eucerin Original Crme  Eucerin Original Lotion  Eucerin Plus Crme Eucerin Plus Lotion  Eucerin TriLipid Replenishing Lotion  Keri Anti-Bacterial Hand Lotion  Keri Deep Conditioning Original Lotion Dry Skin Formula Softly Scented  Keri Deep Conditioning Original Lotion, Fragrance Free Sensitive Skin Formula  Keri Lotion Fast Absorbing Fragrance Free Sensitive Skin Formula  Keri Lotion Fast Absorbing Softly Scented Dry Skin Formula  Keri Original Lotion  Keri Skin Renewal Lotion Keri Silky Smooth Lotion  Keri Silky Smooth Sensitive Skin Lotion  Nivea Body Creamy Conditioning Oil  Nivea Body Extra Enriched Lotion  Nivea Body Original Lotion  Nivea Body Sheer Moisturizing Lotion Nivea Crme  Nivea Skin Firming Lotion  NutraDerm 30 Skin Lotion  NutraDerm Skin Lotion  NutraDerm Therapeutic Skin Cream  NutraDerm Therapeutic Skin Lotion  ProShield Protective Hand Cream  Provon moisturizing lotion  How to Use an Incentive Spirometer  An incentive spirometer is a tool that measures how well you are filling your lungs with each breath. Learning to take long, deep breaths using this tool can help you keep your lungs clear and active. This may help to reverse or lessen your chance of developing breathing (pulmonary) problems, especially infection. You may be asked to use a spirometer: After a surgery. If you have a lung problem or a history of smoking. After a long period of time when you have been unable to move or be active. If the spirometer includes an indicator to show the highest number that you have reached, your health care provider or respiratory therapist will help you set a goal. Keep a log of your progress as told by your health care provider. What are the risks? Breathing too quickly may  cause dizziness or cause you to pass out. Take your time so you do not get dizzy or light-headed. If you are in pain, you may need to take pain medicine before doing incentive spirometry. It is harder to take a deep breath if you are having pain. How to use your incentive spirometer  Sit up on the edge of your bed or on a chair. Hold the incentive spirometer so that it is in an upright position. Before you use the spirometer, breathe out normally. Place the mouthpiece in your mouth. Make sure your lips are closed tightly around it. Breathe in slowly and as deeply as you can through your mouth, causing  the piston or the ball to rise toward the top of the chamber. Hold your breath for 3-5 seconds, or for as long as possible. If the spirometer includes a coach indicator, use this to guide you in breathing. Slow down your breathing if the indicator goes above the marked areas. Remove the mouthpiece from your mouth and breathe out normally. The piston or ball will return to the bottom of the chamber. Rest for a few seconds, then repeat the steps 10 or more times. Take your time and take a few normal breaths between deep breaths so that you do not get dizzy or light-headed. Do this every 1-2 hours when you are awake. If the spirometer includes a goal marker to show the highest number you have reached (best effort), use this as a goal to work toward during each repetition. After each set of 10 deep breaths, cough a few times. This will help to make sure that your lungs are clear. If you have an incision on your chest or abdomen from surgery, place a pillow or a rolled-up towel firmly against the incision when you cough. This can help to reduce pain while taking deep breaths and coughing. General tips When you are able to get out of bed: Walk around often. Continue to take deep breaths and cough in order to clear your lungs. Keep using the incentive spirometer until your health care provider says it is  okay to stop using it. If you have been in the hospital, you may be told to keep using the spirometer at home. Contact a health care provider if: You are having difficulty using the spirometer. You have trouble using the spirometer as often as instructed. Your pain medicine is not giving enough relief for you to use the spirometer as told. You have a fever. Get help right away if: You develop shortness of breath. You develop a cough with bloody mucus from the lungs. You have fluid or blood coming from an incision site after you cough. Summary An incentive spirometer is a tool that can help you learn to take long, deep breaths to keep your lungs clear and active. You may be asked to use a spirometer after a surgery, if you have a lung problem or a history of smoking, or if you have been inactive for a long period of time. Use your incentive spirometer as instructed every 1-2 hours while you are awake. If you have an incision on your chest or abdomen, place a pillow or a rolled-up towel firmly against your incision when you cough. This will help to reduce pain. Get help right away if you have shortness of breath, you cough up bloody mucus, or blood comes from your incision when you cough. This information is not intended to replace advice given to you by your health care provider. Make sure you discuss any questions you have with your health care provider. Document Revised: 09/23/2019 Document Reviewed: 09/23/2019 Elsevier Patient Education  2023 [employer].

## 2024-04-02 ENCOUNTER — Encounter: Payer: Self-pay | Admitting: Orthopedic Surgery

## 2024-04-02 NOTE — H&P (Signed)
 ORTHOPAEDIC HISTORY & PHYSICAL Drake Fonda L[place], [place] - 03/27/2024 3:30 PM EDT Formatting of this note is different from the original. NAME: Natalie Torres H&P Date: 03/27/2024 Procedure Date: 04/03/2024  Chief Complaint: right hip pain  HPI Natalie Torres is a 83 y.o. female who has severe Right hip pain. Patient reports an approximate 45-month history of worsening right hip and buttocks discomfort. She states that the pain mostly localizes along the posterior and lateral margins of her hip. She states that it is made worse with any prolonged standing or ambulation. It greatly affects her ability to ambulate long distances and perform her ADLs as she would like. She also states that it tends to bother her a lot at night. She does report a history of lower back issues secondary to scoliosis with radicular symptoms. However, patient has been seen in physiatry and received ESI injections without any benefit or relief of symptoms either. She states that her pain seems to be getting worse and worse, and is most noticeable when trying to rotate her hip. She has failed conservative treatment including activity modification, previous hip and ESI injections, Tylenol  and use of ambulation devices including walker and cane. She is currently utilizing a cane during today's visit she is unable to tolerate oral NSAIDs secondary to chronic kidney disease and Eliquis  use. She has requested operative intervention for relief of her DJD symptoms. Patient does admit to history of cardiac issues including paroxysmal atrial fibrillation for which she takes Eliquis  regularly. She denies any previous pulmonary issues. No previous DVTs or clots. She is a prediabetic, her last A1c was at 6.0. She denies having any other previous surgeries on this hip.  Social Hx: Patient lives with her husband at [facility]. She does admit to 1 standard glass of wine per week. Denies any illicit drug use, smoking or nicotine use.  Of note,  patient states that she will work with [facility] after surgery, but would like to transition to working with [facility] PT 2 weeks after surgery.  Medications & Allergies Allergies: Allergies Allergen Reactions Hydrocodone Anxiety Agitated with minimal pain relief  Home Medicines: Current Outpatient Medications on File Prior to Visit Medication Sig Dispense Refill acetaminophen  (TYLENOL ) 325 MG tablet Take 325 mg by mouth every 4 (four) hours as needed for Pain. AMIOdarone  (PACERONE ) 200 MG tablet Take 0.5 tablets (100 mg total) by mouth every other day 30 tablet 11 cholecalciferol, vitamin D3, (VITAMIN D3) 125 mcg (5,000 unit) tablet Take 5,000 Units by mouth once daily.  dilTIAZem  (CARDIZEM  CD) 240 MG CD capsule 1 capsule dilTIAZem  (CARDIZEM  CD) 240 MG CD capsule Take 240 mg by mouth once daily diltiazem  (CARDIZEM ) 30 MG tablet Take 1 tablet (30 mg total) by mouth as directed 30 tablet 2 ELIQUIS  5 mg tablet TAKE 1 TABLET BY MOUTH TWICE DAILY 60 tablet 11 estradioL  (VAGIFEM ) 10 mcg vaginal tablet PLACE 1 TABLET VAGINALLY TWICE A WEEK 8 tablet 3 FUROsemide  (LASIX ) 20 MG tablet Take 20 mg by mouth as needed gabapentin  (NEURONTIN ) 100 MG capsule Take 2 capsules (200 mg total) by mouth at bedtime 180 capsule 3 methenamine  hippurate (HIPREX) 1 gram tablet TAKE 1 TABLET BY MOUTH 2 TIMES DAILY. 180 tablet 3 methocarbamoL (ROBAXIN) 500 MG tablet TAKE 1/2 TO 1 TABLET BY MOUTH AT BEDTIMEAS NEEDED 60 tablet 5 mirtazapine  (REMERON ) 15 MG tablet TAKE 1 TABLET BY MOUTH AT BEDTIME 90 tablet 1 omeprazole (PRILOSEC) 40 MG DR capsule Take 1 capsule (40 mg  total) by mouth 2 (two) times daily before meals 180 capsule 3 potassium chloride  (KLOR-CON ) 10 MEQ ER tablet Take 10 mEq by mouth once daily as needed (with Lasix ) rosuvastatin  (CRESTOR ) 5 MG tablet TAKE 1 TABLET BY MOUTH AT BEDTIME 30 tablet 5 traMADoL  (ULTRAM ) 50 mg tablet TAKE 1 TABLET BY MOUTH EVERY 8 HOURS AS NEEDED FOR PAIN 90  tablet 1  No current facility-administered medications on file prior to visit.  Medical / Surgical History  Past Medical History: Diagnosis Date Arrhythmia several years ago Cholecystitis Gall bladder removed 2015 Cholelithiasis without obstruction 2015 Gall bladder removed 2015 Diverticulosis 2008 Hearing loss with bilateral hearing aids Hiatal hernia 01/09/2017 Hyperlipidemia Insomnia Lymphoma (CMS/HHS-HCC) 2013 Follicular lymphoma Mechanical back pain Neurocardiogenic syncope Osteoarthritis Osteopenia Postmenopausal status Scoliosis Sleep apnea   Past Surgical History: Procedure Laterality Date CHOLECYSTECTOMY 2015 EGD N/A 04/20/2017 Procedure: ESOPHAGOGASTRODUODENOSCOPY, FLEXIBLE, TRANSORAL; DIAGNOSTIC, INCLUDING COLLECTION OF SPECIMEN(S) BY BRUSHING OR WASHING, WHEN PERFORMED (SEPARATE PROCEDURE); Surgeon: Elmo Donnice Pinch, MD; Location: [facility] OR; Service: Cardiothoracic; Laterality: N/A; CATARACT EXTRACTION CHOLECYSTECTOMY laparoscopic COLONOSCOPY [id number] HYSTERECTOMY LYMPH NODE BIOPSY 2015 [place] Lymphoma OOPHORECTOMY 2008 POLYPECTOMY 2008 Status post hysterectomy/oophorectomy Status post pelvic reconstruction surgery TONSILLECTOMY UPPER GASTROINTESTINAL ENDOSCOPY 2010   Physical Exam  Ht:154.8 cm (5' 0.95) Wt:60.5 kg (133 lb 6.4 oz) BMI: Body mass index is 25.25 kg/m.  General/Constitutional: No apparent distress: well-nourished and well developed. Eyes: Pupils equal, round with synchronous movement. Lymphatic: No palpable adenopathy. Respiratory: Patient has good chest rise and fall with inspiration and expiration. All lung fields are clear to auscultation bilaterally. There is no Rales, rhonchi or wheezes appreciated. Cardiovascular: Upon auscultation there is a regular rate and rhythm without any murmurs, rubs, gallops or heaves appreciated. There does not appear to be any swelling down the lower extremities. Posterior tibial pulses  appreciated bilaterally, 2+. Integumentary: No impressive skin lesions present, except as noted in detailed exam. Neuro/Psych: Normal mood and affect, oriented to person, place and time. Musculoskeletal: see exam below  Right hip exam Right Hip:  Upon inspection of the patient's right hip, there does not appear to be any noticeable swelling, erythema or open abrasion.  Pelvic tilt: Negative Limb lengths: Equal with the patient standing Soft tissue swelling: Negative Erythema: Negative Crepitance: Negative Tenderness: Greater trochanter is nontender to palpation. Mild pain is elicited by axial compression or extremes of rotation. Atrophy: No atrophy. Fair hip flexor and abductor strength. Range of Motion: EXT/FLEX: -/95 ADD/ABD: -/25 IR/ER: 10/15  Patient is neurovascularly intact to all dermatomes extending down there Right lower extremity to all dermatomes. Posterior tibial pulses were appreciated, 2+. Patient was noted to have mild edema in her bilateral lower extremities.  Imaging Hip Imaging: None ordered today. Previous images from 02/13/2024 were reviewed. Upon inspection, there is noticeable loss of femoral acetabular cartilage space notably along the superior margins of the right acetabulum. Subchondral sclerosis appreciated. Osteophyte formation is present. No fractures, lytic lesions or gross deformities appreciated on films.  Assesment and Plan Hip DJD  I have recommended that Natalie Torres undergo right total hip replacement. Consents has been signed. The risks, benefits, prognosis and alternatives including but not limited to DVT, PE, infection, neurovascular injury, failure of the procedure and death were explained to the patient and she is willing to proceed with surgery as described to her by myself. Plan will be for post operative admission of at least 1 midnight for pain control and PT. She will be managed with DVT prophylaxis, antibiotics preoperatively for 24  hours  and aggressive in patient rehab.  Pre, intra and post op interventions were discussed. Patient has good understanding  Medication Reconciliation was performed. Discussed cessation of Eliquis , vitamins and supplements.  A total of 40 minutes was spent reviewing patient's charts, medical reconciliation, discussing/educating the patient about surgical interventions, and answering any questions provided by the patient.  JOSHUA DALLAS KOYANAGI, PA [facility] 03/27/2024  Electronically signed by KOYANAGI Fonda DALLAS, PA at 03/27/2024 4:53 PM EDT  Back to top of Progress Notes Edmundo Reynolds BRAVO, LPN - [id number]/7974 3:30 PM EDT Formatting of this note might be different from the original. Review of Systems HENT: Positive for hearing loss. Musculoskeletal: Positive for back pain, gait problem and joint swelling. All other systems reviewed and are negative.  Electronically signed by KOYANAGI Fonda DALLAS, PA at 03/27/2024 4:53 PM EDT

## 2024-04-03 ENCOUNTER — Other Ambulatory Visit: Payer: Self-pay

## 2024-04-03 ENCOUNTER — Observation Stay

## 2024-04-03 ENCOUNTER — Ambulatory Visit: Payer: Self-pay | Admitting: Anesthesiology

## 2024-04-03 ENCOUNTER — Observation Stay
Admission: RE | Admit: 2024-04-03 | Discharge: 2024-04-05 | Disposition: A | Discharge status: Home / Self Care | Source: Ambulatory Visit | Attending: Orthopedic Surgery | Admitting: Orthopedic Surgery

## 2024-04-03 ENCOUNTER — Encounter
Admission: RE | Disposition: A | Discharge status: Home / Self Care | Payer: Self-pay | Source: Ambulatory Visit | Attending: Orthopedic Surgery

## 2024-04-03 ENCOUNTER — Encounter: Payer: Self-pay | Admitting: Orthopedic Surgery

## 2024-04-03 DIAGNOSIS — M1611 Unilateral primary osteoarthritis, right hip: Secondary | ICD-10-CM | POA: Diagnosis not present

## 2024-04-03 DIAGNOSIS — Z79899 Other long term (current) drug therapy: Secondary | ICD-10-CM | POA: Diagnosis not present

## 2024-04-03 DIAGNOSIS — C8290 Follicular lymphoma, unspecified, unspecified site: Secondary | ICD-10-CM

## 2024-04-03 DIAGNOSIS — Z01818 Encounter for other preprocedural examination: Secondary | ICD-10-CM

## 2024-04-03 DIAGNOSIS — Z87891 Personal history of nicotine dependence: Secondary | ICD-10-CM | POA: Diagnosis not present

## 2024-04-03 DIAGNOSIS — Z7901 Long term (current) use of anticoagulants: Secondary | ICD-10-CM | POA: Insufficient documentation

## 2024-04-03 DIAGNOSIS — C8590 Non-Hodgkin lymphoma, unspecified, unspecified site: Secondary | ICD-10-CM

## 2024-04-03 DIAGNOSIS — Z8572 Personal history of non-Hodgkin lymphomas: Secondary | ICD-10-CM | POA: Insufficient documentation

## 2024-04-03 DIAGNOSIS — M25551 Pain in right hip: Secondary | ICD-10-CM | POA: Diagnosis present

## 2024-04-03 DIAGNOSIS — Z96641 Presence of right artificial hip joint: Secondary | ICD-10-CM

## 2024-04-03 HISTORY — PX: TOTAL HIP ARTHROPLASTY: SHX124

## 2024-04-03 LAB — ABO/RH: ABO/RH(D): B NEG

## 2024-04-03 SURGERY — ARTHROPLASTY, HIP, TOTAL,POSTERIOR APPROACH
Anesthesia: Spinal | Site: Hip | Laterality: Right

## 2024-04-03 MED ORDER — CEFAZOLIN SODIUM-DEXTROSE 2-4 GM/100ML-% IV SOLN
2.0000 g | Freq: Four times a day (QID) | INTRAVENOUS | Status: AC
  Administered 2024-04-03 (×2): 2 g via INTRAVENOUS
  Filled 2024-04-03: qty 100

## 2024-04-03 MED ORDER — ROSUVASTATIN CALCIUM 5 MG PO TABS
5.0000 mg | ORAL_TABLET | Freq: Every day | ORAL | Status: DC
  Administered 2024-04-03 – 2024-04-04 (×2): 5 mg via ORAL
  Filled 2024-04-03 (×2): qty 1

## 2024-04-03 MED ORDER — TRAMADOL HCL 50 MG PO TABS
50.0000 mg | ORAL_TABLET | ORAL | Status: DC | PRN
  Administered 2024-04-03 – 2024-04-05 (×6): 50 mg via ORAL
  Filled 2024-04-03 (×6): qty 1

## 2024-04-03 MED ORDER — OXYCODONE HCL 5 MG PO TABS: 10.0000 mg | ORAL_TABLET | ORAL | Status: DC | PRN

## 2024-04-03 MED ORDER — TRANEXAMIC ACID-NACL 1000-0.7 MG/100ML-% IV SOLN
1000.0000 mg | Freq: Once | INTRAVENOUS | Status: AC
  Administered 2024-04-03: 1000 mg via INTRAVENOUS

## 2024-04-03 MED ORDER — DEXAMETHASONE SODIUM PHOSPHATE 10 MG/ML IJ SOLN
8.0000 mg | Freq: Once | INTRAMUSCULAR | Status: AC
  Administered 2024-04-03: 8 mg via INTRAVENOUS

## 2024-04-03 MED ORDER — OXYCODONE HCL 5 MG PO TABS: 5.0000 mg | ORAL_TABLET | ORAL | Status: DC | PRN

## 2024-04-03 MED ORDER — BUPIVACAINE HCL (PF) 0.5 % IJ SOLN
INTRAMUSCULAR | Status: DC | PRN
  Administered 2024-04-03: 2.5 mL

## 2024-04-03 MED ORDER — MIDAZOLAM HCL 2 MG/2ML IJ SOLN
INTRAMUSCULAR | Status: AC
  Filled 2024-04-03: qty 2

## 2024-04-03 MED ORDER — MIRTAZAPINE 15 MG PO TABS
15.0000 mg | ORAL_TABLET | Freq: Every day | ORAL | Status: DC
  Administered 2024-04-03 – 2024-04-04 (×2): 15 mg via ORAL
  Filled 2024-04-03 (×2): qty 1

## 2024-04-03 MED ORDER — PROPOFOL 1000 MG/100ML IV EMUL
INTRAVENOUS | Status: AC
  Filled 2024-04-03: qty 100

## 2024-04-03 MED ORDER — ALUM & MAG HYDROXIDE-SIMETH 200-200-20 MG/5ML PO SUSP
30.0000 mL | ORAL | Status: DC | PRN
  Administered 2024-04-03 – 2024-04-04 (×3): 30 mL via ORAL
  Filled 2024-04-03 (×3): qty 30

## 2024-04-03 MED ORDER — METHENAMINE MANDELATE 0.5 G PO TABS
1.0000 g | ORAL_TABLET | Freq: Four times a day (QID) | ORAL | Status: DC
  Administered 2024-04-03 – 2024-04-05 (×4): 1 g via ORAL
  Filled 2024-04-03 (×8): qty 2

## 2024-04-03 MED ORDER — TRANEXAMIC ACID-NACL 1000-0.7 MG/100ML-% IV SOLN
INTRAVENOUS | Status: AC
  Filled 2024-04-03: qty 100

## 2024-04-03 MED ORDER — PROPOFOL 500 MG/50ML IV EMUL
INTRAVENOUS | Status: DC | PRN
  Administered 2024-04-03: 65 ug/kg/min via INTRAVENOUS

## 2024-04-03 MED ORDER — GABAPENTIN 100 MG PO CAPS
200.0000 mg | ORAL_CAPSULE | Freq: Every day | ORAL | Status: DC
  Administered 2024-04-04: 200 mg via ORAL
  Filled 2024-04-03: qty 2

## 2024-04-03 MED ORDER — OXYCODONE HCL 5 MG/5ML PO SOLN: 5.0000 mg | Freq: Once | ORAL | Status: AC | PRN

## 2024-04-03 MED ORDER — HYDROMORPHONE HCL 1 MG/ML IJ SOLN: 0.5000 mg | INTRAMUSCULAR | Status: DC | PRN

## 2024-04-03 MED ORDER — DILTIAZEM HCL ER COATED BEADS 240 MG PO CP24
240.0000 mg | ORAL_CAPSULE | Freq: Every day | ORAL | Status: DC
  Administered 2024-04-05: 240 mg via ORAL
  Filled 2024-04-03 (×2): qty 1

## 2024-04-03 MED ORDER — CHLORHEXIDINE GLUCONATE 0.12 % MT SOLN
OROMUCOSAL | Status: AC
Start: 2024-04-03 — End: 2024-04-03
  Filled 2024-04-03: qty 15

## 2024-04-03 MED ORDER — MENTHOL 3 MG MT LOZG: 1.0000 | LOZENGE | OROMUCOSAL | Status: DC | PRN

## 2024-04-03 MED ORDER — ACETAMINOPHEN 325 MG PO TABS
325.0000 mg | ORAL_TABLET | Freq: Four times a day (QID) | ORAL | Status: DC | PRN
  Administered 2024-04-05: 650 mg via ORAL
  Filled 2024-04-03: qty 2

## 2024-04-03 MED ORDER — PROPOFOL 10 MG/ML IV BOLUS
INTRAVENOUS | Status: DC | PRN
  Administered 2024-04-03: 40 mg via INTRAVENOUS
  Administered 2024-04-03 (×2): 20 mg via INTRAVENOUS

## 2024-04-03 MED ORDER — CHLORHEXIDINE GLUCONATE 4 % EX SOLN
60.0000 mL | Freq: Once | CUTANEOUS | Status: AC
  Administered 2024-04-03: 4 via TOPICAL

## 2024-04-03 MED ORDER — FLEET ENEMA RE ENEM: 1.0000 | ENEMA | Freq: Once | RECTAL | Status: DC | PRN

## 2024-04-03 MED ORDER — APIXABAN 2.5 MG PO TABS
5.0000 mg | ORAL_TABLET | Freq: Two times a day (BID) | ORAL | Status: DC
  Administered 2024-04-04 – 2024-04-05 (×3): 5 mg via ORAL
  Filled 2024-04-03 (×3): qty 2

## 2024-04-03 MED ORDER — ORAL CARE MOUTH RINSE: 15.0000 mL | Freq: Once | OROMUCOSAL | Status: AC

## 2024-04-03 MED ORDER — GLYCOPYRROLATE 0.2 MG/ML IJ SOLN
INTRAMUSCULAR | Status: DC | PRN
  Administered 2024-04-03: .2 mg via INTRAVENOUS

## 2024-04-03 MED ORDER — EPHEDRINE SULFATE-NACL 50-0.9 MG/10ML-% IV SOSY
PREFILLED_SYRINGE | INTRAVENOUS | Status: DC | PRN
  Administered 2024-04-03 (×2): 10 mg via INTRAVENOUS
  Administered 2024-04-03: 5 mg via INTRAVENOUS

## 2024-04-03 MED ORDER — CEFAZOLIN SODIUM-DEXTROSE 2-4 GM/100ML-% IV SOLN
INTRAVENOUS | Status: AC
  Filled 2024-04-03: qty 100

## 2024-04-03 MED ORDER — DEXAMETHASONE SODIUM PHOSPHATE 10 MG/ML IJ SOLN
INTRAMUSCULAR | Status: AC
  Filled 2024-04-03: qty 1

## 2024-04-03 MED ORDER — CHLORHEXIDINE GLUCONATE 0.12 % MT SOLN
15.0000 mL | Freq: Once | OROMUCOSAL | Status: AC
  Administered 2024-04-03: 15 mL via OROMUCOSAL

## 2024-04-03 MED ORDER — FUROSEMIDE 20 MG PO TABS
20.0000 mg | ORAL_TABLET | ORAL | Status: DC | PRN
Start: 2024-04-03 — End: 2024-04-05

## 2024-04-03 MED ORDER — CELECOXIB 200 MG PO CAPS
200.0000 mg | ORAL_CAPSULE | Freq: Two times a day (BID) | ORAL | Status: DC
  Administered 2024-04-03 – 2024-04-05 (×4): 200 mg via ORAL
  Filled 2024-04-03 (×4): qty 1

## 2024-04-03 MED ORDER — ONDANSETRON HCL 4 MG PO TABS
4.0000 mg | ORAL_TABLET | Freq: Four times a day (QID) | ORAL | Status: DC | PRN
  Administered 2024-04-03: 4 mg via ORAL
  Filled 2024-04-03: qty 1

## 2024-04-03 MED ORDER — OXYCODONE HCL 5 MG PO TABS
5.0000 mg | ORAL_TABLET | Freq: Once | ORAL | Status: AC | PRN
  Administered 2024-04-03: 5 mg via ORAL

## 2024-04-03 MED ORDER — FENTANYL CITRATE (PF) 100 MCG/2ML IJ SOLN
INTRAMUSCULAR | Status: AC
  Filled 2024-04-03: qty 2

## 2024-04-03 MED ORDER — CELECOXIB 200 MG PO CAPS
400.0000 mg | ORAL_CAPSULE | Freq: Once | ORAL | Status: AC
  Administered 2024-04-03: 400 mg via ORAL

## 2024-04-03 MED ORDER — LACTATED RINGERS IV SOLN
INTRAVENOUS | Status: DC
Start: 2024-04-03 — End: 2024-04-03

## 2024-04-03 MED ORDER — ENSURE PRE-SURGERY PO LIQD
296.0000 mL | Freq: Once | ORAL | Status: AC
  Administered 2024-04-03: 296 mL via ORAL
  Filled 2024-04-03: qty 296

## 2024-04-03 MED ORDER — PHENYLEPHRINE 80 MCG/ML (10ML) SYRINGE FOR IV PUSH (FOR BLOOD PRESSURE SUPPORT)
PREFILLED_SYRINGE | INTRAVENOUS | Status: DC | PRN
  Administered 2024-04-03 (×5): 80 ug via INTRAVENOUS
  Administered 2024-04-03: 160 ug via INTRAVENOUS
  Administered 2024-04-03 (×4): 80 ug via INTRAVENOUS

## 2024-04-03 MED ORDER — FENTANYL CITRATE (PF) 100 MCG/2ML IJ SOLN: 25.0000 ug | INTRAMUSCULAR | Status: DC | PRN

## 2024-04-03 MED ORDER — PHENYLEPHRINE HCL-NACL 20-0.9 MG/250ML-% IV SOLN
INTRAVENOUS | Status: DC | PRN
  Administered 2024-04-03: 20 ug/min via INTRAVENOUS

## 2024-04-03 MED ORDER — DIPHENHYDRAMINE HCL 12.5 MG/5ML PO ELIX: 12.5000 mg | ORAL_SOLUTION | ORAL | Status: DC | PRN

## 2024-04-03 MED ORDER — MAGNESIUM HYDROXIDE 400 MG/5ML PO SUSP
30.0000 mL | Freq: Every day | ORAL | Status: DC
  Administered 2024-04-05: 30 mL via ORAL
  Filled 2024-04-03: qty 30

## 2024-04-03 MED ORDER — BISACODYL 10 MG RE SUPP: 10.0000 mg | Freq: Every day | RECTAL | Status: DC | PRN

## 2024-04-03 MED ORDER — METOCLOPRAMIDE HCL 10 MG PO TABS
10.0000 mg | ORAL_TABLET | Freq: Three times a day (TID) | ORAL | Status: DC
  Administered 2024-04-03 – 2024-04-05 (×7): 10 mg via ORAL
  Filled 2024-04-03 (×7): qty 1

## 2024-04-03 MED ORDER — PHENYLEPHRINE HCL-NACL 20-0.9 MG/250ML-% IV SOLN
INTRAVENOUS | Status: AC
  Filled 2024-04-03: qty 250

## 2024-04-03 MED ORDER — PANTOPRAZOLE SODIUM 40 MG PO TBEC
40.0000 mg | DELAYED_RELEASE_TABLET | Freq: Two times a day (BID) | ORAL | Status: DC
  Administered 2024-04-03 – 2024-04-05 (×5): 40 mg via ORAL
  Filled 2024-04-03 (×5): qty 1

## 2024-04-03 MED ORDER — CELECOXIB 200 MG PO CAPS
ORAL_CAPSULE | ORAL | Status: AC
  Filled 2024-04-03: qty 2

## 2024-04-03 MED ORDER — SODIUM CHLORIDE 0.9 % IV SOLN: INTRAVENOUS | Status: DC

## 2024-04-03 MED ORDER — AMIODARONE HCL 200 MG PO TABS
100.0000 mg | ORAL_TABLET | Freq: Every day | ORAL | Status: DC
  Administered 2024-04-04 – 2024-04-05 (×2): 100 mg via ORAL
  Filled 2024-04-03 (×3): qty 1

## 2024-04-03 MED ORDER — EPHEDRINE 5 MG/ML INJ
INTRAVENOUS | Status: AC
  Filled 2024-04-03: qty 5

## 2024-04-03 MED ORDER — PHENYLEPHRINE 80 MCG/ML (10ML) SYRINGE FOR IV PUSH (FOR BLOOD PRESSURE SUPPORT)
PREFILLED_SYRINGE | INTRAVENOUS | Status: AC
  Filled 2024-04-03: qty 10

## 2024-04-03 MED ORDER — GABAPENTIN 300 MG PO CAPS
300.0000 mg | ORAL_CAPSULE | Freq: Once | ORAL | Status: AC
  Administered 2024-04-03: 300 mg via ORAL

## 2024-04-03 MED ORDER — FERROUS SULFATE 325 (65 FE) MG PO TABS
325.0000 mg | ORAL_TABLET | Freq: Two times a day (BID) | ORAL | Status: DC
  Administered 2024-04-03 – 2024-04-05 (×4): 325 mg via ORAL
  Filled 2024-04-03 (×4): qty 1

## 2024-04-03 MED ORDER — SURGIPHOR WOUND IRRIGATION SYSTEM - OPTIME
TOPICAL | Status: DC | PRN
  Administered 2024-04-03: 450 mL

## 2024-04-03 MED ORDER — CEFAZOLIN SODIUM-DEXTROSE 2-4 GM/100ML-% IV SOLN
2.0000 g | INTRAVENOUS | Status: AC
  Administered 2024-04-03: 2 g via INTRAVENOUS

## 2024-04-03 MED ORDER — ACETAMINOPHEN 10 MG/ML IV SOLN
1000.0000 mg | Freq: Four times a day (QID) | INTRAVENOUS | Status: AC
  Administered 2024-04-03 – 2024-04-04 (×4): 1000 mg via INTRAVENOUS
  Filled 2024-04-03 (×4): qty 100

## 2024-04-03 MED ORDER — SENNOSIDES-DOCUSATE SODIUM 8.6-50 MG PO TABS
1.0000 | ORAL_TABLET | Freq: Two times a day (BID) | ORAL | Status: DC
Start: 2024-04-03 — End: 2024-04-05
  Administered 2024-04-03 – 2024-04-05 (×5): 1 via ORAL
  Filled 2024-04-03 (×5): qty 1

## 2024-04-03 MED ORDER — SODIUM CHLORIDE 0.9 % IR SOLN
Status: DC | PRN
  Administered 2024-04-03: 3000 mL

## 2024-04-03 MED ORDER — BUPIVACAINE HCL (PF) 0.5 % IJ SOLN
INTRAMUSCULAR | Status: AC
  Filled 2024-04-03: qty 10

## 2024-04-03 MED ORDER — 0.9 % SODIUM CHLORIDE (POUR BTL) OPTIME
TOPICAL | Status: DC | PRN
  Administered 2024-04-03: 500 mL

## 2024-04-03 MED ORDER — MUPIROCIN 2 % EX OINT: 1.0000 | TOPICAL_OINTMENT | Freq: Two times a day (BID) | CUTANEOUS | 0 refills | Status: DC

## 2024-04-03 MED ORDER — FENTANYL CITRATE (PF) 100 MCG/2ML IJ SOLN
INTRAMUSCULAR | Status: DC | PRN
  Administered 2024-04-03 (×3): 25 ug via INTRAVENOUS

## 2024-04-03 MED ORDER — ONDANSETRON HCL 4 MG/2ML IJ SOLN: 4.0000 mg | Freq: Four times a day (QID) | INTRAMUSCULAR | Status: DC | PRN

## 2024-04-03 MED ORDER — CHLORHEXIDINE GLUCONATE 4 % EX SOLN: 1.0000 | CUTANEOUS | 1 refills | Status: DC

## 2024-04-03 MED ORDER — TRANEXAMIC ACID-NACL 1000-0.7 MG/100ML-% IV SOLN
1000.0000 mg | INTRAVENOUS | Status: AC
  Administered 2024-04-03: 1000 mg via INTRAVENOUS

## 2024-04-03 MED ORDER — POTASSIUM CHLORIDE ER 10 MEQ PO TBCR: 10.0000 meq | EXTENDED_RELEASE_TABLET | ORAL | Status: DC | PRN

## 2024-04-03 MED ORDER — PHENOL 1.4 % MT LIQD: 1.0000 | OROMUCOSAL | Status: DC | PRN

## 2024-04-03 MED ORDER — OXYCODONE HCL 5 MG PO TABS
ORAL_TABLET | ORAL | Status: AC
  Filled 2024-04-03: qty 1

## 2024-04-03 MED ORDER — GLYCOPYRROLATE 0.2 MG/ML IJ SOLN
INTRAMUSCULAR | Status: AC
  Filled 2024-04-03: qty 1

## 2024-04-03 MED ORDER — ONDANSETRON HCL 4 MG/2ML IJ SOLN
INTRAMUSCULAR | Status: DC | PRN
  Administered 2024-04-03: 4 mg via INTRAVENOUS

## 2024-04-03 MED ORDER — GABAPENTIN 300 MG PO CAPS
ORAL_CAPSULE | ORAL | Status: AC
  Filled 2024-04-03: qty 1

## 2024-04-03 MED ORDER — ONDANSETRON HCL 4 MG/2ML IJ SOLN
INTRAMUSCULAR | Status: AC
  Filled 2024-04-03: qty 2

## 2024-04-03 SURGICAL SUPPLY — 46 items
BLADE SAW 90X25X1.19 OSCILLAT (BLADE) ×1 IMPLANT
BRUSH SCRUB EZ PLAIN DRY (MISCELLANEOUS) ×1 IMPLANT
CUP ACETBLR 52 OD 100 SERIES (Hips) IMPLANT
DRAPE INCISE IOBAN 66X60 STRL (DRAPES) ×1 IMPLANT
DRAPE SHEET LG 3/4 BI-LAMINATE (DRAPES) ×1 IMPLANT
DRSG AQUACEL AG ADV 3.5X14 (GAUZE/BANDAGES/DRESSINGS) ×1 IMPLANT
DRSG MEPILEX SACRM 8.7X9.8 (GAUZE/BANDAGES/DRESSINGS) ×1 IMPLANT
DRSG TEGADERM 4X4.75 (GAUZE/BANDAGES/DRESSINGS) ×1 IMPLANT
DURAPREP 26ML APPLICATOR (WOUND CARE) ×2 IMPLANT
ELECT CAUTERY BLADE 6.4 (BLADE) ×1 IMPLANT
ELECTRODE REM PT RTRN 9FT ADLT (ELECTROSURGICAL) ×1 IMPLANT
EVACUATOR 1/8 PVC DRAIN (DRAIN) ×1 IMPLANT
GAUZE XEROFORM 1X8 LF (GAUZE/BANDAGES/DRESSINGS) ×1 IMPLANT
GLOVE BIOGEL M STRL SZ7.5 (GLOVE) ×6 IMPLANT
GLOVE BIOGEL PI IND STRL 8 (GLOVE) ×1 IMPLANT
GLOVE SRG 8 PF TXTR STRL LF DI (GLOVE) ×1 IMPLANT
GOWN STRL REUS W/ TWL LRG LVL3 (GOWN DISPOSABLE) ×2 IMPLANT
GOWN STRL REUS W/ TWL XL LVL3 (GOWN DISPOSABLE) ×1 IMPLANT
GOWN TOGA ZIPPER T7+ PEEL AWAY (MISCELLANEOUS) ×1 IMPLANT
HANDLE YANKAUER SUCT OPEN TIP (MISCELLANEOUS) ×1 IMPLANT
HEAD M SROM 36MM PLUS 1.5 (Hips) IMPLANT
HOLDER FOLEY CATH W/STRAP (MISCELLANEOUS) ×1 IMPLANT
HOOD PEEL AWAY T7 (MISCELLANEOUS) ×1 IMPLANT
KIT PEG BOARD PINK (KITS) ×1 IMPLANT
KIT TURNOVER KIT A (KITS) ×1 IMPLANT
LINER MARATHON 4MM 10DEG 36X52 (Hips) IMPLANT
MANIFOLD NEPTUNE II (INSTRUMENTS) ×2 IMPLANT
NS IRRIG 500ML POUR BTL (IV SOLUTION) ×1 IMPLANT
PACK HIP PROSTHESIS (MISCELLANEOUS) ×1 IMPLANT
PENCIL SMOKE EVACUATOR COATED (MISCELLANEOUS) ×1 IMPLANT
PIN STEIN THRED 5/32 (Pin) ×1 IMPLANT
SOL .9 NS 3000ML IRR UROMATIC (IV SOLUTION) ×1 IMPLANT
SOLUTION IRRIG SURGIPHOR (IV SOLUTION) ×1 IMPLANT
SPONGE DRAIN TRACH 4X4 STRL 2S (GAUZE/BANDAGES/DRESSINGS) ×1 IMPLANT
STAPLER SKIN PROX 35W (STAPLE) ×1 IMPLANT
STEM FEMORAL SZ5 HIGH ACTIS (Stem) IMPLANT
SUT ETHIBOND #5 BRAIDED 30INL (SUTURE) ×1 IMPLANT
SUT VIC AB 0 CT1 36 (SUTURE) ×2 IMPLANT
SUT VIC AB 1 CT1 36 (SUTURE) ×2 IMPLANT
SUT VIC AB 2-0 CT1 TAPERPNT 27 (SUTURE) ×1 IMPLANT
TAPE CLOTH 3X10 WHT NS LF (GAUZE/BANDAGES/DRESSINGS) ×1 IMPLANT
TIP FAN IRRIG PULSAVAC PLUS (DISPOSABLE) ×1 IMPLANT
TOWEL OR 17X26 4PK STRL BLUE (TOWEL DISPOSABLE) IMPLANT
TRAP FLUID SMOKE EVACUATOR (MISCELLANEOUS) ×1 IMPLANT
TRAY FOLEY MTR SLVR 16FR STAT (SET/KITS/TRAYS/PACK) ×1 IMPLANT
WATER STERILE IRR 1000ML POUR (IV SOLUTION) ×1 IMPLANT

## 2024-04-03 NOTE — Discharge Summary (Signed)
 Physician Discharge Summary  Subjective: 1 Day Post-Op Procedure(s) (LRB): ARTHROPLASTY, HIP, TOTAL,POSTERIOR APPROACH (Right) Patient reports pain as mild.   Patient seen in rounds with Dr. Mardee. Patient is well, and has had no acute complaints or problems Denies any CP, SOB, N/V, fevers or chills We will continue therapy today.  Patient is ready to go home  Physician Discharge Summary  Patient ID: Natalie Torres MRN: [id number] DOB/AGE: [birth year] 83 y.o.  Admit date: 04/03/2024 Discharge date: 04/04/2024  Admission Diagnoses:  Discharge Diagnoses:  Principal Problem:   Hx of total hip arthroplasty, right   Discharged Condition: good  Hospital Course: Patient presented to the hospital on 04/03/2024 for an elective right total hip arthroplasty performed by Dr. Mardee. Patient was given 1g of TXA and 2g of Ancef  prior to the procedure. she tolerated the procedure well without any complications. See procedural note below for details. Postoperatively, the patient did very well. she was able to pass PT protocols on post-op day one without any issues. JP drain was removed without any difficulty and was intact. she was able to void her bladder without any difficulty. Physical exam was unremarkable. she denies any SOB, CP, N/V, fevers or chills. Vital signs are stable. Patient is stable to discharge home.   PROCEDURE:  Right total hip arthroplasty   SURGEON:  Natalie Torres. M.D.   ASSISTANT:  Sidra Koyanagi, PA-C (present and scrubbed throughout the case, critical for assistance with exposure, retraction, instrumentation, and closure)   ANESTHESIA: spinal   ESTIMATED BLOOD LOSS: 100 mL   FLUIDS REPLACED: 1300 mL of crystalloid   DRAINS: 2 medium Hemovac drains   IMPLANTS UTILIZED: DePuy size 5 high offset Actis femoral stem, 52 mm OD Pinnacle 100 acetabular component, +4 mm 10 degree Pinnacle Marathon polyethylene insert, and a 36 mm M-SPEC +1.5 mm hip ball  Treatments:  none  Discharge Exam: Blood pressure 110/66, pulse 60, temperature 97.8 F (36.6 C), temperature source Oral, resp. rate 16, height 5' (1.524 m), weight 61.2 kg, SpO2 94%.   Disposition: home   Allergies as of 04/04/2024       Reactions   Hydrocodone Swelling   Agitated with minimal pain relief, Hyperactivity, Not effective for pain, Agitated with minimal pain relief        Medication List     TAKE these medications    acetaminophen  500 MG tablet Commonly known as: TYLENOL  Take 500 mg by mouth at bedtime.   amiodarone  200 MG tablet Commonly known as: PACERONE  Take 0.5 tablets (100 mg total) by mouth daily.   apixaban  5 MG Tabs tablet Commonly known as: Eliquis  Take 1 tablet (5 mg total) by mouth 2 (two) times daily.   celecoxib  200 MG capsule Commonly known as: CELEBREX  Take 1 capsule (200 mg total) by mouth 2 (two) times daily.   chlorhexidine  4 % external liquid Commonly known as: HIBICLENS  Apply 15 mLs (1 Application total) topically as directed for 30 doses. Use as directed daily for 5 days every other week for 6 weeks.   Cholecalciferol 125 MCG (5000 UT) Tabs Take 5,000 Units by mouth daily.   Culturelle Caps Take 1 capsule by mouth daily.   diltiazem  240 MG 24 hr capsule Commonly known as: CARDIZEM  CD TAKE 1 CAPSULE BY MOUTH ONCE DAILY   diphenhydramine -acetaminophen  25-500 MG Tabs tablet Commonly known as: TYLENOL  PM Take 1 tablet by mouth at bedtime.   Estradiol  10 MCG Tabs vaginal tablet Place 10 mcg vaginally every 14 (fourteen)  days.   furosemide  20 MG tablet Commonly known as: LASIX  Take 1 tablet (20 mg total) by mouth as needed. What changed:  when to take this reasons to take this additional instructions   gabapentin  100 MG capsule Commonly known as: NEURONTIN  Take 200 mg by mouth at bedtime.   methenamine  1 g tablet Commonly known as: HIPREX Take 1 g by mouth 2 (two) times daily with a meal.   mirtazapine  15 MG  tablet Commonly known as: REMERON  Take 15 mg by mouth at bedtime.   mupirocin  ointment 2 % Commonly known as: BACTROBAN  Place 1 Application into the nose 2 (two) times daily for 60 doses. Use as directed 2 times daily for 5 days every other week for 6 weeks.   omeprazole 40 MG capsule Commonly known as: PRILOSEC Take 40 mg by mouth in the morning and at bedtime.   oxyCODONE  5 MG immediate release tablet Commonly known as: Oxy IR/ROXICODONE  Take 1 tablet (5 mg total) by mouth every 4 (four) hours as needed for moderate pain (pain score 4-6) (pain score 4-6).   potassium chloride  10 MEQ tablet Commonly known as: KLOR-CON  Take 1 tablet (10 mEq total) by mouth as needed (With the Lasix  when needed for swelling).   rosuvastatin  5 MG tablet Commonly known as: CRESTOR  TAKE 1 TABLET BY MOUTH NIGHTLY   traMADol  50 MG tablet Commonly known as: ULTRAM  Take 25 mg by mouth 3 (three) times daily. What changed: Another medication with the same name was added. Make sure you understand how and when to take each.   traMADol  50 MG tablet Commonly known as: ULTRAM  Take 1-2 tablets (50-100 mg total) by mouth every 4 (four) hours as needed for moderate pain (pain score 4-6). What changed: You were already taking a medication with the same name, and this prescription was added. Make sure you understand how and when to take each.               Durable Medical Equipment  (From admission, onward)           Start     Ordered   04/03/24 1128  DME Walker rolling  Once       Question:  Patient needs a walker to treat with the following condition  Answer:  S/P total hip arthroplasty   04/03/24 1127   04/03/24 1128  DME Bedside commode  Once       Comments: Patient is not able to walk the distance required to go the bathroom, or he/she is unable to safely negotiate stairs required to access the bathroom.  A 3in1 BSC will alleviate this problem  Question:  Patient needs a bedside commode to  treat with the following condition  Answer:  S/P total hip arthroplasty   04/03/24 1127            Follow-up Information     Hooten, Natalie SQUIBB, MD Follow up on 05/16/2024.   Specialty: Orthopedic Surgery Why: at 3:00pm Contact information: [address] [facility] [place] [address] [phone]                 Signed: Sidra Koyanagi 04/04/2024, 8:43 AM   Objective: Vital signs in last 24 hours: Temp:  [97.2 F (36.2 C)-98.2 F (36.8 C)] 97.8 F (36.6 C) (09/18 0742) Pulse Rate:  [51-63] 60 (09/18 0742) Resp:  [13-20] 16 (09/18 0742) BP: (101-122)/(56-87) 110/66 (09/18 0742) SpO2:  [94 %-100 %] 94 % (09/18 0742)  Intake/Output  from previous day:  Intake/Output Summary (Last 24 hours) at 04/04/2024 0843 Last data filed at 04/03/2024 2211 Gross per 24 hour  Intake 2841.67 ml  Output 890 ml  Net 1951.67 ml    Intake/Output this shift: No intake/output data recorded.  Labs: No results for input(s): HGB in the last 72 hours. No results for input(s): WBC, RBC, HCT, PLT in the last 72 hours. No results for input(s): NA, K, CL, CO2, BUN, CREATININE, GLUCOSE, CALCIUM  in the last 72 hours. No results for input(s): LABPT, INR in the last 72 hours.  EXAM: General - Patient is Alert, Appropriate, and Oriented Extremity - Neurologically intact Neurovascular intact Sensation intact distally Intact pulses distally Dorsiflexion/Plantar flexion intact No cellulitis present Compartment soft Dressing - dressing C/D/I and no drainage Motor Function - intact, moving foot and toes well on exam. JP Drain pulled without difficulty. Intact  Assessment/Plan: 1 Day Post-Op Procedure(s) (LRB): ARTHROPLASTY, HIP, TOTAL,POSTERIOR APPROACH (Right) Procedure(s) (LRB): ARTHROPLASTY, HIP, TOTAL,POSTERIOR APPROACH (Right) Past Medical History:  Diagnosis Date   Anxiety    Arthritis    Cancer (HCC)    lymphoma   Complication  of anesthesia    slow to wake/very nauseous after pelvic surgery   GERD (gastroesophageal reflux disease)    History of hiatal hernia    HLD (hyperlipidemia)    HOH (hard of hearing)    aids   Insomnia    Mechanical back pain    Neurocardiogenic syncope    Osteoarthritis    Osteopenia    PONV (postoperative nausea and vomiting)    Postmenopausal status    Recurrent UTI    Scoliosis    Sleep apnea    Sleep apnea    Principal Problem:   Hx of total hip arthroplasty, right  Estimated body mass index is 26.37 kg/m as calculated from the following:   Height as of this encounter: 5' (1.524 m).   Weight as of this encounter: 61.2 kg.   Patient will continue to work with physical therapy to pass postoperative PT protocols, ROM and strengthening   Hip Preacutions   Discussed with the patient continuing to utilize ice over the bandage   Patient will wear TED hose bilaterally to help prevent DVT and clot formation   Discussed the Aquacel bandage.  This bandage will stay in place 7 days postoperatively.  Can be replaced with honeycomb bandages that will be sent home with the patient   Discussed sending the patient home with tramadol  and oxycodone  for as needed pain management.  Patient will also be sent home with Celebrex  to help with swelling and inflammation.  Patient will take at at home Eliquis  for DVT prophylaxis   JP drain removed without difficulty, intact   Weight-Bearing as tolerated to right leg   Patient will follow-up with [facility] orthopedics in 6 weeks for re-imaging and reevaluation  Diet - Regular diet Follow up - in 6 weeks Activity - WBAT Disposition - Home Condition Upon Discharge - Good DVT Prophylaxis - TED hose and Eliquis   Fonda CHARLENA Koyanagi, PA-C Orthopaedic Surgery 04/04/2024, 8:43 AM

## 2024-04-03 NOTE — Anesthesia Procedure Notes (Signed)
 Spinal  Patient location during procedure: OR Start time: 04/03/2024 7:18 AM End time: 04/03/2024 7:24 AM Reason for block: surgical anesthesia Staffing Performed: resident/CRNA  Performed by: Veronica Alm BROCKS, CRNA Authorized by: Stevan Fairy POUR, MD   Preanesthetic Checklist Completed: patient identified, IV checked, site marked, risks and benefits discussed, surgical consent, monitors and equipment checked, pre-op evaluation and timeout performed Spinal Block Patient position: sitting Prep: DuraPrep Patient monitoring: heart rate, cardiac monitor, continuous pulse ox and blood pressure Approach: midline Location: L3-4 Injection technique: single-shot Needle Needle type: Sprotte  Needle gauge: 24 G Needle length: 9 cm Assessment Sensory level: T4 Events: CSF return

## 2024-04-03 NOTE — Evaluation (Signed)
 Physical Therapy Evaluation Patient Details Name: Natalie Torres MRN: [id number] DOB: Oct 30, 1940 Today's Date: 04/03/2024  History of Present Illness  Natalie Torres is a 83 y.o. female who with worsening R hip pain for the last 6 months. Pt is POD 0 for R THA on day of evaluation.  Clinical Impression  Pt is a pleasant 83 year old female who is POD 0 after R THA on day of evaluation. Prior to sx, pt used rollator intermittently but used walls and furniture for balance in her home. She lives with her husband and her daughter lives close by. Pt performs bed mobility and requires MOD A for supine >sit to get to EOB for LE management. Pt used elevated HOB and bed rails to assist. With initial stand,  pt demonstrates shakiness/inc postural sway requiring min A to stand at EOB, but improved with continued once ambulating. Pt demonstrates ambulation with R lateral lean and kyphosis d/t scoliosis, however is able to maintain balance. SPT provided Min A while ambulating. Pt also has tendency to internally rotate her R hip while ambulating. Max cuing required to adhere to posterior hip precautions and for technique while ambulating. Would benefit from skilled PT to address above deficits and promote optimal return to PLOF. While patient is able to perform functional mobility, she may need more physical assistance than what she currently has available. Disposition may change with progression with PT.        If plan is discharge home, recommend the following: A little help with walking and/or transfers;A little help with bathing/dressing/bathroom;Assistance with cooking/housework (increased supervision upon discharge home d/t dec balance)   Can travel by private vehicle   Yes    Equipment Recommendations Rolling walker (2 wheels);BSC/3in1  Recommendations for Other Services       Functional Status Assessment Patient has had a recent decline in their functional status and demonstrates the ability to make  significant improvements in function in a reasonable and predictable amount of time.     Precautions / Restrictions Precautions Precautions: Fall;Posterior Hip Precaution Booklet Issued: No Recall of Precautions/Restrictions: Intact Restrictions Weight Bearing Restrictions Per Provider Order: Yes RLE Weight Bearing Per Provider Order: Weight bearing as tolerated      Mobility  Bed Mobility Overal bed mobility: Needs Assistance Bed Mobility: Supine to Sit     Supine to sit: Mod assist     General bed mobility comments: Assistance for LE management and use of chuck pad to get to EOB. Pt guards her RLE when SPT tries to assist her RLE to sit at EOB.    Transfers Overall transfer level: Needs assistance Equipment used: Rolling walker (2 wheels) Transfers: Sit to/from Stand, Bed to chair/wheelchair/BSC Sit to Stand: Min assist   Step pivot transfers: Min assist       General transfer comment: MAX verbal cues for sequencing/hand placement and to adhere to posterior hip precautions.    Ambulation/Gait Ambulation/Gait assistance: Min assist Gait Distance (Feet): 40 Feet Assistive device: Rolling walker (2 wheels) Gait Pattern/deviations: Step-to pattern, Trunk flexed, Narrow base of support Gait velocity: dec     General Gait Details: Cuing to adhere to posterior hip precautions, and technique. Pt has tendency for hip IR while ambulating and to walk with RW too far. Pt also ambulates with R lateral lean and kyphosis d/t scoliosis (pt reported).  Stairs            Wheelchair Mobility     Tilt Bed    Modified Rankin (  Stroke Patients Only)       Balance Overall balance assessment: Needs assistance Sitting-balance support: Feet supported, Single extremity supported Sitting balance-Leahy Scale: Fair Sitting balance - Comments: Pt maintained UE support from bed rail when sitting at EOB. Able to maintain balance.   Standing balance support: Bilateral upper  extremity supported, During functional activity, Reliant on assistive device for balance Standing balance-Leahy Scale: Poor Standing balance comment: Shaky when standing and required many cues to adhere to hip precautions. Edu about technique.                             Pertinent Vitals/Pain Pain Assessment Pain Assessment: No/denies pain    Home Living Family/patient expects to be discharged to:: Private residence Living Arrangements: Spouse/significant other Available Help at Discharge: Family (daughter lives close by and can help PRN) Type of Home: Independent living facility Home Access: Level entry       Home Layout: One level Home Equipment: Rollator (4 wheels)      Prior Function Prior Level of Function : Independent/Modified Independent             Mobility Comments: Pt reports no use of AD for mobility however she uses walls and furniture to maintain her balance ADLs Comments: independent     Extremity/Trunk Assessment   Upper Extremity Assessment Upper Extremity Assessment: Overall WFL for tasks assessed    Lower Extremity Assessment Lower Extremity Assessment: RLE deficits/detail RLE Deficits / Details: Pt able to perform AAROM for SLR. DF WNL. RLE Sensation: WNL    Cervical / Trunk Assessment Cervical / Trunk Assessment: Normal  Communication   Communication Communication: No apparent difficulties    Cognition Arousal: Alert Behavior During Therapy: WFL for tasks assessed/performed   PT - Cognitive impairments: No apparent impairments                       PT - Cognition Comments: Pt pleasant and agreeable to PT session. Daughter and husband at bedside during session. Following commands: Impaired Following commands impaired: Follows one step commands with increased time, Follows one step commands inconsistently     Cueing Cueing Techniques: Verbal cues, Tactile cues, Visual cues     General Comments      Exercises  Other Exercises Other Exercises: Edu about tenting technique with pillows when positioned in bed or recliner. Other Exercises: Edu about posterior hip precautions   Assessment/Plan    PT Assessment Patient needs continued PT services  PT Problem List Decreased strength;Decreased range of motion;Decreased activity tolerance;Decreased balance;Decreased mobility;Decreased knowledge of use of DME       PT Treatment Interventions DME instruction;Gait training;Functional mobility training;Therapeutic activities;Therapeutic exercise;Balance training;Neuromuscular re-education;Patient/family education    PT Goals (Current goals can be found in the Care Plan section)  Acute Rehab PT Goals Patient Stated Goal: to go home PT Goal Formulation: With patient Time For Goal Achievement: 04/17/24 Potential to Achieve Goals: Good    Frequency BID     Co-evaluation               AM-PAC PT 6 Clicks Mobility  Outcome Measure Help needed turning from your back to your side while in a flat bed without using bedrails?: A Little Help needed moving from lying on your back to sitting on the side of a flat bed without using bedrails?: A Little Help needed moving to and from a bed to a chair (including a wheelchair)?:  A Little Help needed standing up from a chair using your arms (e.g., wheelchair or bedside chair)?: A Little Help needed to walk in hospital room?: A Little Help needed climbing 3-5 steps with a railing? : A Lot 6 Click Score: 17    End of Session Equipment Utilized During Treatment: Gait belt Activity Tolerance: Patient tolerated treatment well Patient left: in chair;with call bell/phone within reach;with family/visitor present Nurse Communication: Mobility status PT Visit Diagnosis: Unsteadiness on feet (R26.81);Muscle weakness (generalized) (M62.81)    Time: 8472-8398 PT Time Calculation (min) (ACUTE ONLY): 34 min   Charges:                 Nusayba Cadenas,  SPT   Husayn Reim 04/03/2024, 5:05 PM

## 2024-04-03 NOTE — Op Note (Signed)
 OPERATIVE NOTE  DATE OF SURGERY:  04/03/2024  PATIENT NAME:  Natalie Torres   DOB: Feb 04, 1941  MRN: [id number]  PRE-OPERATIVE DIAGNOSIS: Degenerative arthrosis of the right hip, primary  POST-OPERATIVE DIAGNOSIS:  Same  PROCEDURE:  Right total hip arthroplasty  SURGEON:  Lynwood SHAUNNA Mardee Mickey. M.D.  ASSISTANT:  Sidra Koyanagi, PA-C (present and scrubbed throughout the case, critical for assistance with exposure, retraction, instrumentation, and closure)  ANESTHESIA: spinal  ESTIMATED BLOOD LOSS: 100 mL  FLUIDS REPLACED: 1300 mL of crystalloid  DRAINS: 2 medium Hemovac drains  IMPLANTS UTILIZED: DePuy size 5 high offset Actis femoral stem, 52 mm OD Pinnacle 100 acetabular component, +4 mm 10 degree Pinnacle Marathon polyethylene insert, and a 36 mm M-SPEC +1.5 mm hip ball  INDICATIONS FOR SURGERY: Natalie Torres is a 83 y.o. year old female with a long history of progressive hip and groin  pain. X-rays demonstrated severe degenerative changes. The patient had not seen any significant improvement despite conservative nonsurgical intervention. After discussion of the risks and benefits of surgical intervention, the patient expressed understanding of the risks benefits and agree with plans for total hip arthroplasty.   The risks, benefits, and alternatives were discussed at length including but not limited to the risks of infection, bleeding, nerve injury, stiffness, blood clots, the need for revision surgery, limb length inequality, dislocation, cardiopulmonary complications, among others, and they were willing to proceed.  PROCEDURE IN DETAIL: The patient was brought into the operating room and, after adequate spinal anesthesia was achieved, the patient was placed in a left lateral decubitus position. Axillary roll was placed and all bony prominences were well-padded. The patient's right hip was cleaned and prepped with alcohol and DuraPrep and draped in the usual sterile fashion. A timeout  was performed as per usual protocol. A lateral curvilinear incision was made gently curving towards the posterior superior iliac spine. The IT band was incised in line with the skin incision and the fibers of the gluteus maximus were split in line. The piriformis tendon was identified, skeletonized, and incised at its insertion to the proximal femur and reflected posteriorly. A T type posterior capsulotomy was performed. Prior to dislocation of the femoral head, a threaded Steinmann pin was inserted through a separate stab incision into the pelvis superior to the acetabulum and bent in the form of a stylus so as to assess limb length and hip offset throughout the procedure. The femoral head was then dislocated posteriorly. Inspection of the femoral head demonstrated severe degenerative changes with full-thickness loss of articular cartilage. The femoral neck cut was performed using an oscillating saw. The anterior capsule was elevated off of the femoral neck using a periosteal elevator. Attention was then directed to the acetabulum. The remnant of the labrum was excised using electrocautery. Inspection of the acetabulum also demonstrated significant degenerative changes. The acetabulum was reamed in sequential fashion up to a 51 mm diameter. Good punctate bleeding bone was encountered. A 52 mm Pinnacle 100 acetabular component was positioned and impacted into place. Good scratch fit was appreciated. A +4 mm neutral polyethylene trial was inserted.  Attention was then directed to the proximal femur.  Femoral broaches were inserted in a sequential fashion up to a size 5 broach. Calcar region was planed and a trial reduction was performed using a standard offset neck and a 36 mm hip ball with a +1.5 mm neck length.  Reasonably good stability was noted but it was elected to trial with a +4 mm 10  degree liner with the high side at the 8 o'clock position and exchanging the standard offset for a high offset neck.  Good  equalization of limb lengths and hip offset was appreciated and excellent stability was noted both anteriorly and posteriorly. Trial components were removed. The acetabular shell was irrigated with copious amounts of normal saline with antibiotic solution and suctioned dry. A +4 mm 10 degree Pinnacle Marathon polyethylene insert was positioned with a high side at the 8 o'clock position and impacted into place. Next, a size 5 high offset Actis femoral stem was positioned and impacted into place. Excellent scratch fit was appreciated. A trial reduction was again performed with a 36 mm hip ball with a +1.5 mm neck length. Again, good equalization of limb lengths was appreciated and excellent stability appreciated both anteriorly and posteriorly. The hip was then dislocated and the trial hip ball was removed. The Morse taper was cleaned and dried. A 36 mm M-SPEC hip ball with a +1.5 mm neck length was placed on the trunnion and impacted into place. The hip was then reduced and placed through range of motion. Excellent stability was appreciated both anteriorly and posteriorly.  The wound was irrigated with copious amounts of normal saline followed by 450 ml of Surgiphor and suctioned dry. Good hemostasis was appreciated. The posterior capsulotomy was repaired using #5 Ethibond. Piriformis tendon was reapproximated to the undersurface of the gluteus medius tendon using #5 Ethibond. The IT band was reapproximated using interrupted sutures of #1 Vicryl. Subcutaneous tissue was approximated using first #0 Vicryl followed by #2-0 Vicryl. The skin was closed with skin staples.  The patient tolerated the procedure well and was transported to the recovery room in stable condition.   Lynwood SHAUNNA Mardee Mickey., M.D.

## 2024-04-03 NOTE — Plan of Care (Signed)
 Verbalizes understanding of plan of care, discharge instructions and follow up

## 2024-04-03 NOTE — Anesthesia Preprocedure Evaluation (Addendum)
 Anesthesia Evaluation  Patient identified by MRN, date of birth, ID band Patient awake    Reviewed: Allergy & Precautions, NPO status , Patient's Chart, lab work & pertinent test results  History of Anesthesia Complications (+) PONV and history of anesthetic complications  Airway Mallampati: III  TM Distance: <3 FB Neck ROM: full    Dental  (+) Chipped   Pulmonary neg shortness of breath, sleep apnea , former smoker   Pulmonary exam normal        Cardiovascular Exercise Tolerance: Good + dysrhythmias Atrial Fibrillation      Neuro/Psych  Neuromuscular disease  negative psych ROS   GI/Hepatic Neg liver ROS, hiatal hernia,GERD  Controlled,,  Endo/Other  negative endocrine ROS    Renal/GU      Musculoskeletal   Abdominal   Peds  Hematology negative hematology ROS (+)   Anesthesia Other Findings Past Medical History: No date: Anxiety No date: Arthritis No date: Cancer (HCC)     Comment:  lymphoma No date: Complication of anesthesia     Comment:  slow to wake/very nauseous after pelvic surgery No date: GERD (gastroesophageal reflux disease) No date: History of hiatal hernia No date: HLD (hyperlipidemia) No date: HOH (hard of hearing)     Comment:  aids No date: Insomnia No date: Mechanical back pain No date: Neurocardiogenic syncope No date: Osteoarthritis No date: Osteopenia No date: PONV (postoperative nausea and vomiting) No date: Postmenopausal status No date: Recurrent UTI No date: Scoliosis No date: Sleep apnea No date: Sleep apnea  Past Surgical History: No date: ABDOMINAL HYSTERECTOMY No date: BLADDER SUSPENSION 10/06/2022: CARDIOVERSION; N/A     Comment:  Procedure: CARDIOVERSION;  Surgeon: Perla Evalene PARAS,               MD;  Location: [facility] ORS;  Service: Cardiovascular;                Laterality: N/A; 12/02/2014: CATARACT EXTRACTION W/PHACO; Right     Comment:  Procedure: CATARACT  EXTRACTION PHACO AND INTRAOCULAR               LENS PLACEMENT (IOC);  Surgeon: Elsie Carmine, MD;                Location: [facility] ORS;  Service: Ophthalmology;  Laterality:              Right;  US   00:38 AP% 41.5 CDE 7.22 12/16/2014: CATARACT EXTRACTION W/PHACO; Left     Comment:  Procedure: CATARACT EXTRACTION PHACO AND INTRAOCULAR               LENS PLACEMENT (IOC);  Surgeon: Elsie Carmine, MD;                Location: [facility] ORS;  Service: Ophthalmology;  Laterality:              Left;  US  00:41 AP% 21.4 CDE 8.74 No date: CHOLECYSTECTOMY No date: EYE SURGERY     Comment:  CATARACT No date: HIATAL HERNIA REPAIR No date: TONSILLECTOMY  BMI    Body Mass Index: 26.37 kg/m      Reproductive/Obstetrics negative OB ROS                              Anesthesia Physical Anesthesia Plan  ASA: 3  Anesthesia Plan: Spinal   Post-op Pain Management:    Induction:   PONV Risk Score and Plan:   Airway Management Planned: Natural  Airway and Nasal Cannula  Additional Equipment:   Intra-op Plan:   Post-operative Plan:   Informed Consent: I have reviewed the patients History and Physical, chart, labs and discussed the procedure including the risks, benefits and alternatives for the proposed anesthesia with the patient or authorized representative who has indicated his/her understanding and acceptance.     Dental Advisory Given  Plan Discussed with: Anesthesiologist, CRNA and Surgeon  Anesthesia Plan Comments: (Reports more than 72 hours since last Eliquis  dose   Patient reports no bleeding problems.  Plan for spinal with backup GA  Patient consented for risks of anesthesia including but not limited to:  - adverse reactions to medications - damage to eyes, teeth, lips or other oral mucosa - nerve damage due to positioning  - risk of bleeding, infection and or nerve damage from spinal that could lead to paralysis - risk of headache or failed  spinal - damage to teeth, lips or other oral mucosa - sore throat or hoarseness - damage to heart, brain, nerves, lungs, other parts of body or loss of life  Patient voiced understanding and assent.)         Anesthesia Quick Evaluation

## 2024-04-03 NOTE — Progress Notes (Signed)
 Subjective: 1 Day Post-Op Procedure(s) (LRB): ARTHROPLASTY, HIP, TOTAL,POSTERIOR APPROACH (Right) Patient reports pain as mild.   Patient seen in rounds with Dr. Mardee. Patient is well, and has had no acute complaints or problems Denies any CP, SOB, N/V, fevers or chills We will start therapy today.  Plan is to go Home after hospital stay.  Objective: Vital signs in last 24 hours: Temp:  [97.2 F (36.2 C)-98.2 F (36.8 C)] 97.8 F (36.6 C) (09/18 0742) Pulse Rate:  [51-63] 60 (09/18 0742) Resp:  [13-20] 16 (09/18 0742) BP: (101-122)/(56-87) 110/66 (09/18 0742) SpO2:  [94 %-100 %] 94 % (09/18 0742)  Intake/Output from previous day:  Intake/Output Summary (Last 24 hours) at 04/04/2024 0840 Last data filed at 04/03/2024 2211 Gross per 24 hour  Intake 2841.67 ml  Output 890 ml  Net 1951.67 ml    Intake/Output this shift: No intake/output data recorded.  Labs: No results for input(s): HGB in the last 72 hours. No results for input(s): WBC, RBC, HCT, PLT in the last 72 hours. No results for input(s): NA, K, CL, CO2, BUN, CREATININE, GLUCOSE, CALCIUM  in the last 72 hours. No results for input(s): LABPT, INR in the last 72 hours.  EXAM General - Patient is Alert, Appropriate, and Oriented Extremity - Neurologically intact Neurovascular intact Sensation intact distally Intact pulses distally Dorsiflexion/Plantar flexion intact No cellulitis present Compartment soft Dressing - dressing C/D/I and no drainage Motor Function - intact, moving foot and toes well on exam. JP Drain pulled without difficulty. Intact  Past Medical History:  Diagnosis Date   Anxiety    Arthritis    Cancer (HCC)    lymphoma   Complication of anesthesia    slow to wake/very nauseous after pelvic surgery   GERD (gastroesophageal reflux disease)    History of hiatal hernia    HLD (hyperlipidemia)    HOH (hard of hearing)    aids   Insomnia    Mechanical back pain     Neurocardiogenic syncope    Osteoarthritis    Osteopenia    PONV (postoperative nausea and vomiting)    Postmenopausal status    Recurrent UTI    Scoliosis    Sleep apnea    Sleep apnea     Assessment/Plan: 1 Day Post-Op Procedure(s) (LRB): ARTHROPLASTY, HIP, TOTAL,POSTERIOR APPROACH (Right) Principal Problem:   Hx of total hip arthroplasty, right  Estimated body mass index is 26.37 kg/m as calculated from the following:   Height as of this encounter: 5' (1.524 m).   Weight as of this encounter: 61.2 kg. Advance diet Up with therapy  Patient will continue to work with physical therapy to pass postoperative PT protocols, ROM and strengthening  Hip Preacutions  Discussed with the patient continuing to utilize ice over the bandage  Patient will wear TED hose bilaterally to help prevent DVT and clot formation  Discussed the Aquacel bandage.  This bandage will stay in place 7 days postoperatively.  Can be replaced with honeycomb bandages that will be sent home with the patient  Discussed sending the patient home with tramadol  and oxycodone  for as needed pain management.  Patient will also be sent home with Celebrex  to help with swelling and inflammation.  Patient will take at at home Eliquis  for DVT prophylaxis  JP drain removed without difficulty, intact  Weight-Bearing as tolerated to right leg  Patient will follow-up with [facility] orth[facility]ics in 6 weeks for re-imaging and reevaluation   Fonda Koyanagi, PA-C [facility] 04/04/2024,  8:40 AM

## 2024-04-03 NOTE — Interval H&P Note (Signed)
 History and Physical Interval Note:  04/03/2024 6:11 AM  [name]  has presented today for surgery, with the diagnosis of Primary osteoarthritis of right hip.  The various methods of treatment have been discussed with the patient and family. After consideration of risks, benefits and other options for treatment, the patient has consented to  Procedure(s): ARTHROPLASTY, HIP, TOTAL,POSTERIOR APPROACH (Right) as a surgical intervention.  The patient's history has been reviewed, patient examined, no change in status, stable for surgery.  I have reviewed the patient's chart and labs.  Questions were answered to the patient's satisfaction.     Nakiah Osgood P Locke Barrell

## 2024-04-03 NOTE — Progress Notes (Signed)
 Patient is not able to walk the distance required to go the bathroom, or he/she is unable to safely negotiate stairs required to access the bathroom.  A 3in1 BSC will alleviate this problem   Amenda Duclos P. Angie Fava M.D.

## 2024-04-03 NOTE — Transfer of Care (Signed)
 Immediate Anesthesia Transfer of Care Note  Patient: Natalie Torres  Procedure(s) Performed: ARTHROPLASTY, HIP, TOTAL,POSTERIOR APPROACH (Right: Hip)  Patient Location: PACU  Anesthesia Type:Spinal  Level of Consciousness: awake, alert , and oriented  Airway & Oxygen Therapy: Patient Spontanous Breathing  Post-op Assessment: Report given to RN  Post vital signs: Reviewed and stable  Last Vitals:  Vitals Value Taken Time  BP 102/56 04/03/24 10:23  Temp 36.5 C 04/03/24 10:23  Pulse 59 04/03/24 10:25  Resp 14 04/03/24 10:25  SpO2 96 % 04/03/24 10:25  Vitals shown include unfiled device data.  Last Pain:  Vitals:   04/03/24 0621  TempSrc: Temporal  PainSc: 0-No pain         Complications: No notable events documented.

## 2024-04-04 ENCOUNTER — Encounter: Payer: Self-pay | Admitting: Orthopedic Surgery

## 2024-04-04 ENCOUNTER — Other Ambulatory Visit: Payer: Self-pay

## 2024-04-04 DIAGNOSIS — M1611 Unilateral primary osteoarthritis, right hip: Secondary | ICD-10-CM | POA: Diagnosis not present

## 2024-04-04 MED ORDER — MUPIROCIN 2 % EX OINT
1.0000 | TOPICAL_OINTMENT | Freq: Two times a day (BID) | CUTANEOUS | 0 refills | Status: DC
  Filled 2024-04-04 (×2): qty 44, 22d supply, fill #0

## 2024-04-04 MED ORDER — CHLORHEXIDINE GLUCONATE 4 % EX SOLN
1.0000 | CUTANEOUS | 1 refills | Status: DC
  Filled 2024-04-04: qty 472, 30d supply, fill #0

## 2024-04-04 MED ORDER — OXYCODONE HCL 5 MG PO TABS
5.0000 mg | ORAL_TABLET | ORAL | 0 refills | Status: DC | PRN
  Filled 2024-04-04: qty 30, 5d supply, fill #0

## 2024-04-04 MED ORDER — TRAMADOL HCL 50 MG PO TABS: 50.0000 mg | ORAL_TABLET | ORAL | 0 refills | Status: DC | PRN

## 2024-04-04 MED ORDER — CELECOXIB 200 MG PO CAPS: 200.0000 mg | ORAL_CAPSULE | Freq: Two times a day (BID) | ORAL | 1 refills | Status: DC

## 2024-04-04 MED ORDER — CELECOXIB 200 MG PO CAPS
200.0000 mg | ORAL_CAPSULE | Freq: Two times a day (BID) | ORAL | 1 refills | Status: DC
  Filled 2024-04-04: qty 60, 30d supply, fill #0

## 2024-04-04 MED ORDER — TRAMADOL HCL 50 MG PO TABS
50.0000 mg | ORAL_TABLET | ORAL | 0 refills | Status: DC | PRN
  Filled 2024-04-04: qty 30, 3d supply, fill #0

## 2024-04-04 MED ORDER — OXYCODONE HCL 5 MG PO TABS: 5.0000 mg | ORAL_TABLET | ORAL | 0 refills | Status: DC | PRN

## 2024-04-04 NOTE — Evaluation (Signed)
 Occupational Therapy Evaluation Patient Details Name: Natalie Torres MRN: [id number] DOB: [birth year] Today's Date: 04/04/2024   History of Present Illness   Natalie Torres is a 83 y.o. female who with worsening R hip pain for the last 6 months. Pt is POD 0 for R THA on day of evaluation.     Clinical Impressions Pt seen for OT evaluation this date, POD#1 from above surgery. Pt was Independent/Mod I in all ADLs prior to surgery, however occasionally using rollator for mobility due to R hip pain. Pt is eager to return to PLOF with less pain and improved safety and independence. Pt currently requires Minimal assist for LB dressing while in seated position due to pain and limited AROM of R hip. Pt able to recall 2/3 posterior total hip precautions at start of session and unable to verbalize how to implement during ADL and mobility. Pt instructed in posterior total hip precautions and how to implement, self care skills, falls prevention strategies, home/routines modifications, DME/AE for LB bathing and dressing tasks, and compression stocking mgt strategies. Education on use of AE for LB ADL engagement was provided with visual aid, demonstration, and return demonstration for use of reacher and sock aide.  Education provided on complete hip/knee kit.  At end of session, pt able to recall 3/3 posterior total hip precautions. Pt would benefit from additional instruction in self care skills and techniques to help maintain precautions with or without assistive devices to support recall and carryover prior to discharge. Recommend ongoing skilled OT services upon discharge in order to progress pt along with continuum of care.        If plan is discharge home, recommend the following:   A little help with walking and/or transfers;A little help with bathing/dressing/bathroom;Assistance with cooking/housework     Functional Status Assessment   Patient has had a recent decline in their functional status and  demonstrates the ability to make significant improvements in function in a reasonable and predictable amount of time.     Equipment Recommendations   Other (comment) (defer to next venue of care)     Recommendations for Other Services         Precautions/Restrictions   Precautions Precautions: Fall;Posterior Hip Recall of Precautions/Restrictions: Intact Restrictions Weight Bearing Restrictions Per Provider Order: Yes RLE Weight Bearing Per Provider Order: Weight bearing as tolerated     Mobility Bed Mobility               General bed mobility comments: Pt was in recliner pre/post session    Transfers Overall transfer level: Needs assistance Equipment used: Rolling walker (2 wheels) Transfers: Sit to/from Stand Sit to Stand: Contact guard assist, From elevated surface     Step pivot transfers: Contact guard assist     General transfer comment: CGA for toilet/3:1 BSC and recliner chair with use of DME with cuing for posterior hip precautions      Balance Overall balance assessment: Needs assistance Sitting-balance support: Feet supported Sitting balance-Leahy Scale: Good Sitting balance - Comments: completed LB ADLs seated with Good dynamic sitting balance   Standing balance support: During functional activity, Single extremity supported Standing balance-Leahy Scale: Fair Standing balance comment: pt was able to stand at sink and brush her teeth and wash face with mostly +1 UE support                           ADL either performed or assessed with clinical judgement  ADL Overall ADL's : Needs assistance/impaired     Grooming: Oral care;Wash/dry face;Wash/dry hands;Standing;Contact guard assist;Set up Grooming Details (indicate cue type and reason): Standing at sink     Lower Body Bathing: Moderate assistance Lower Body Bathing Details (indicate cue type and reason): simulation     Lower Body Dressing: Minimal assistance;Sit to/from  stand;Cueing for compensatory techniques;Adhering to hip precautions;Cueing for sequencing;Cueing for safety;With adaptive equipment Lower Body Dressing Details (indicate cue type and reason): posterior hip precautions Toilet Transfer: Contact guard assist;BSC/3in1;Rolling walker (2 wheels)   Toileting- Clothing Manipulation and Hygiene: Contact guard assist;Adhering to hip precautions;Sit to/from stand       Functional mobility during ADLs: Contact guard assist;Rolling walker (2 wheels);Cueing for safety       Vision Baseline Vision/History: 1 Wears glasses Patient Visual Report: No change from baseline       Perception         Praxis         Pertinent Vitals/Pain Pain Assessment Pain Assessment: 0-10 Pain Score: 2  Pain Location: R hip Pain Descriptors / Indicators: Discomfort, Sore Pain Intervention(s): Monitored during session     Extremity/Trunk Assessment Upper Extremity Assessment Upper Extremity Assessment: Generalized weakness   Lower Extremity Assessment Lower Extremity Assessment: Defer to PT evaluation   Cervical / Trunk Assessment Cervical / Trunk Assessment: Normal   Communication Communication Communication: No apparent difficulties   Cognition Arousal: Alert Behavior During Therapy: WFL for tasks assessed/performed                                 Following commands: Intact Following commands impaired: Follows one step commands with increased time     Cueing  General Comments   Cueing Techniques: Verbal cues;Tactile cues  Medical sales representative HEP for hip replacement and had pt perform several of the exercises until breakfast tray arrived.   Exercises Other Exercises Other Exercises: Education on role of OT in Acute, education on ted hose, education on safe transfers with posterior hip precautions Other Exercises: Education on AE (reacher, sock aid - hip/knee kit) during LB ADLs (education provided to pt, spouse, and daughter who  were present at bedside) Other Exercises: education on posterior hip precautions during ADL task engagement   Shoulder Instructions      Home Living Family/patient expects to be discharged to:: Other (Comment) (Lives at [facility]) Living Arrangements: Spouse/significant other Available Help at Discharge: Family Type of Home:  (Lives at [facility]) Home Access: Level entry     Home Layout: One level               Home Equipment: Rollator (4 wheels)          Prior Functioning/Environment Prior Level of Function : Independent/Modified Independent             Mobility Comments: Pt reports no use of AD for mobility however she uses walls and furniture to maintain her balance ADLs Comments: independent    OT Problem List: Decreased strength;Decreased coordination;Decreased activity tolerance;Decreased safety awareness;Decreased knowledge of use of DME or AE;Impaired balance (sitting and/or standing);Decreased knowledge of precautions   OT Treatment/Interventions: Self-care/ADL training;Therapeutic activities;Energy conservation;Patient/family education;DME and/or AE instruction;Balance training      OT Goals(Current goals can be found in the care plan section)   Acute Rehab OT Goals Patient Stated Goal: To walk OT Goal Formulation: With patient Time For Goal Achievement: 04/18/24 Potential to Achieve Goals:  Good ADL Goals Pt Will Perform Grooming: with modified independence;standing Pt Will Perform Lower Body Dressing: with modified independence;with adaptive equipment;sit to/from stand Pt Will Transfer to Toilet: with modified independence;bedside commode Pt Will Perform Toileting - Clothing Manipulation and hygiene: with modified independence;with adaptive equipment;sit to/from stand   OT Frequency:  Min 2X/week    Co-evaluation              AM-PAC OT 6 Clicks Daily Activity     Outcome Measure Help from another person eating meals?:  None Help from another person taking care of personal grooming?: None Help from another person toileting, which includes using toliet, bedpan, or urinal?: A Little Help from another person bathing (including washing, rinsing, drying)?: A Little Help from another person to put on and taking off regular upper body clothing?: None Help from another person to put on and taking off regular lower body clothing?: A Little 6 Click Score: 21   End of Session Equipment Utilized During Treatment: Gait belt;Rolling walker (2 wheels) Nurse Communication: Mobility status  Activity Tolerance: Patient tolerated treatment well Patient left: in chair;with call bell/phone within reach;with family/visitor present  OT Visit Diagnosis: Unsteadiness on feet (R26.81);Muscle weakness (generalized) (M62.81)                Time: 9044-8977 OT Time Calculation (min): 27 min Charges:  OT General Charges $OT Visit: 1 Visit OT Evaluation $OT Eval Moderate Complexity: 1 Mod OT Treatments $Self Care/Home Management : 23-37 mins  Harlene Sharps OTR/L   Harlene LITTIE Sharps 04/04/2024, 10:57 AM

## 2024-04-04 NOTE — TOC Initial Note (Signed)
 Transition of Care [facility]) - Initial/Assessment Note    Patient Details  Name: Natalie Torres MRN: [id number] Date of Birth: 1940/12/16  Transition of Care [facility]) CM/SW Contact:    Alvaro Louder, LCSW Phone Number: 04/04/2024, 9:57 AM  Clinical Narrative:    Per Chart review patient from [facility] with husband. PCP is Reyes Costa, Plan is to return back to [facility] for Rehab then transition back to ALF. LCSWA Faxed out information to [facility].  LCSWA started insurance auth.   Pending JluyPI:[id number]   TOC to follow for discharge                 Patient Goals and CMS Choice            Expected Discharge Plan and Services         Expected Discharge Date: 04/04/24                                    Prior Living Arrangements/Services                       Activities of Daily Living   ADL Screening (condition at time of admission) Independently performs ADLs?: Yes (appropriate for developmental age) Is the patient deaf or have difficulty hearing?: No Does the patient have difficulty seeing, even when wearing glasses/contacts?: No Does the patient have difficulty concentrating, remembering, or making decisions?: No  Permission Sought/Granted                  Emotional Assessment              Admission diagnosis:  Primary osteoarthritis of right hip [M16.11] Hx of total hip arthroplasty, right [Z96.641] Patient Active Problem List   Diagnosis Date Noted   Hx of total hip arthroplasty, right 04/03/2024   Prediabetes 04/04/2022   Persistent atrial fibrillation (HCC) 09/29/2021   Hearing loss 12/18/2018   Hyperlipidemia 12/18/2018   Insomnia 12/18/2018   Mechanical back pain 12/18/2018   Neurocardiogenic syncope 12/18/2018   Osteoarthritis 12/18/2018   Osteopenia 12/18/2018   Postmenopausal status 12/18/2018   Scoliosis 12/18/2018   Sleep apnea 12/18/2018   Esophageal spasm 03/06/2018   Paroxysmal A-fib (HCC) 08/08/2017    Hiatal hernia 01/09/2017   Night sweats 03/09/2016   Recurrent UTI 10/21/2015   Left sided abdominal pain 10/21/2015   Atrophic vaginitis 10/21/2015   History of recurrent UTIs 04/08/2015   Multinodular goiter 04/03/2015   Thyroid  nodule 04/03/2015   Gallstones 06/16/2014   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 [place] [address] Phone: [phone] Fax: [phone]  [facility] [address] [place] [address] Phone: [phone] Fax: [phone]     Social Drivers of Health (SDOH) Social History: SDOH Screenings   Food Insecurity: No Food Insecurity (04/03/2024)  Housing: Low Risk  (04/03/2024)  Transportation Needs: No Transportation Needs (04/03/2024)  Utilities: Not At Risk (04/03/2024)  Financial Resource Strain: Low Risk  (02/13/2024)   Received from [facility]  Social Connections: Socially Integrated (04/03/2024)  Tobacco Use: Medium Risk (04/03/2024)   SDOH Interventions:     Readmission Risk Interventions     No data to display

## 2024-04-04 NOTE — Progress Notes (Signed)
 Meds to bed delivered medications, medication handed to daughter Almarie to take home.

## 2024-04-04 NOTE — Plan of Care (Signed)
   Problem: Education: Goal: Knowledge of General Education information will improve Description Including pain rating scale, medication(s)/side effects and non-pharmacologic comfort measures Outcome: Progressing

## 2024-04-04 NOTE — Care Management Obs Status (Signed)
 MEDICARE OBSERVATION STATUS NOTIFICATION   Patient Details  Name: Natalie Torres MRN: [id number] Date of Birth: [birth year]   Medicare Observation Status Notification Given:  Yes    Rojelio SHAUNNA Rattler 04/04/2024, 11:29 AM

## 2024-04-04 NOTE — Progress Notes (Signed)
 Physical Therapy Treatment Patient Details Name: Natalie Torres MRN: [id number] DOB: 15-Nov-1940 Today's Date: 04/04/2024   History of Present Illness Natalie Torres is a 83 y.o. female who with worsening R hip pain for the last 6 months. Pt is POD 0 for R THA on day of evaluation.    PT Comments  Pt was sitting in recliner with RN at bedside. A and O x 4. Extremely pleasant and agreeable to session. Pt requested to use BR. She was able to stand from recliner to youth RW with CGA-min. Vcs for hand placement and technique while adhere ing posterior hip precautions. Overall pt adheres to precautions throughout session. Successfully urinated and brushed teeth with min assist prior to ambulation ~ 50 ft. Slow antalgic step to gait that she was able to improve to step through with vcs and time. Once back in room, pt was issued HEP and performed several exercises with vcing. Pt remain far from her baseline abilities and will benefit from continued skilled PT at DC to maximize independence and safety with all ADLs.    If plan is discharge home, recommend the following: A little help with walking and/or transfers;A little help with bathing/dressing/bathroom;Assistance with cooking/housework     Equipment Recommendations  Rolling walker (2 wheels);BSC/3in1 (youth RW)       Precautions / Restrictions Precautions Precautions: Fall;Posterior Hip Precaution Booklet Issued: Yes (comment) Recall of Precautions/Restrictions: Intact Restrictions Weight Bearing Restrictions Per Provider Order: Yes RLE Weight Bearing Per Provider Order: Weight bearing as tolerated     Mobility  Bed Mobility  General bed mobility comments: Pt was in recliner pre/post session    Transfers Overall transfer level: Needs assistance Equipment used: Rolling walker (2 wheels) Transfers: Sit to/from Stand Sit to Stand: Contact guard assist, Min assist  General transfer comment: CGA to stand from slightly elevated surfaces     Ambulation/Gait Ambulation/Gait assistance: Contact guard assist, Min assist Gait Distance (Feet): 50 Feet Assistive device: Rolling walker (2 wheels) Gait Pattern/deviations: Step-through pattern, Trunk flexed, Antalgic, Decreased stance time - right, Decreased step length - left Gait velocity: decreased  General Gait Details: Pt ambulated 50 ft with RW. slow antalgic gait however no LOB or safety concerns. distance limited by fatigue. Chartered loss adjuster issued youth RW for more appropriate walker height    Balance Overall balance assessment: Needs assistance Sitting-balance support: Feet supported Sitting balance-Leahy Scale: Good     Standing balance support: During functional activity, Single extremity supported Standing balance-Leahy Scale: Fair Standing balance comment: pt was able to stand at sink and brush her teeth with mostly +1 UE support      Communication Communication Communication: No apparent difficulties  Cognition Arousal: Alert Behavior During Therapy: WFL for tasks assessed/performed   PT - Cognitive impairments: No apparent impairments      PT - Cognition Comments: pt is A and O. cooperative and pleasant Following commands: Intact Following commands impaired: Follows one step commands with increased time    Cueing Cueing Techniques: Verbal cues, Tactile cues     General Comments General comments (skin integrity, edema, etc.): Author issued HEP for hip replacement and had pt perform several of the exercises until breakfast tray arrived.      Pertinent Vitals/Pain Pain Assessment Pain Assessment: 0-10 Pain Score: 3  Pain Location: hip Pain Descriptors / Indicators: Discomfort, Sore     PT Goals (current goals can now be found in the care plan section) Acute Rehab PT Goals Patient Stated Goal: to go home  Frequency    BID       AM-PAC PT 6 Clicks Mobility   Outcome Measure  Help needed turning from your back to your side while in a flat bed  without using bedrails?: A Little Help needed moving from lying on your back to sitting on the side of a flat bed without using bedrails?: A Little Help needed moving to and from a bed to a chair (including a wheelchair)?: A Little Help needed standing up from a chair using your arms (e.g., wheelchair or bedside chair)?: A Little Help needed to walk in hospital room?: A Little Help needed climbing 3-5 steps with a railing? : A Little 6 Click Score: 18    End of Session   Activity Tolerance: Patient tolerated treatment well Patient left: in chair;with call bell/phone within reach;with chair alarm set Nurse Communication: Mobility status PT Visit Diagnosis: Unsteadiness on feet (R26.81);Muscle weakness (generalized) (M62.81)     Time: 9253-9187 PT Time Calculation (min) (ACUTE ONLY): 26 min  Charges:    $Gait Training: 8-22 mins $Therapeutic Activity: 8-22 mins PT General Charges $$ ACUTE PT VISIT: 1 Visit                    Rankin Essex PTA 04/04/24, 8:32 AM

## 2024-04-04 NOTE — Anesthesia Postprocedure Evaluation (Signed)
 Anesthesia Post Note  Patient: Natalie Torres  Procedure(s) Performed: ARTHROPLASTY, HIP, TOTAL,POSTERIOR APPROACH (Right: Hip)  Patient location during evaluation: Nursing Unit Anesthesia Type: Spinal Level of consciousness: oriented and awake and alert Pain management: pain level controlled Vital Signs Assessment: post-procedure vital signs reviewed and stable Respiratory status: spontaneous breathing and respiratory function stable Cardiovascular status: blood pressure returned to baseline and stable Postop Assessment: no headache, no backache, no apparent nausea or vomiting, patient able to bend at knees and able to ambulate Anesthetic complications: no   No notable events documented.   Last Vitals:  Vitals:   04/03/24 1953 04/03/24 2350  BP: 122/80 120/78  Pulse: (!) 58 60  Resp: 16 16  Temp: 36.7 C 36.8 C  SpO2: 100% 99%    Last Pain:  Vitals:   04/04/24 0202  TempSrc:   PainSc: 2                  Fairy POUR Soraiya Ahner

## 2024-04-04 NOTE — Progress Notes (Signed)
 Pts BP 104/58, pt has Cardizem  240 mg scheduled, per Dr. Hooten, hold dose.

## 2024-04-04 NOTE — NC FL2 (Signed)
 [place]  MEDICAID [id number] LEVEL OF CARE FORM     IDENTIFICATION  Patient Name: Natalie Torres Birthdate: [birth year] Sex: female Admission Date (Current Location): 04/03/2024  [place] and [place] Number:  Chiropodist and Address:  [facility], [address] [place], [place] [address]      Provider Number: [id number]  Attending Physician Name and Address:  Mardee Lynwood SHAUNNA, MD  Relative Name and Phone Number:       Current Level of Care: Hospital Recommended Level of Care: Skilled Nursing Facility Prior Approval Number:    Date Approved/Denied:   PASRR Number: [id number]  Discharge Plan: SNF    Current Diagnoses: Patient Active Problem List   Diagnosis Date Noted   Hx of total hip arthroplasty, right 04/03/2024   Prediabetes 04/04/2022   Persistent atrial fibrillation (HCC) 09/29/2021   Hearing loss 12/18/2018   Hyperlipidemia 12/18/2018   Insomnia 12/18/2018   Mechanical back pain 12/18/2018   Neurocardiogenic syncope 12/18/2018   Osteoarthritis 12/18/2018   Osteopenia 12/18/2018   Postmenopausal status 12/18/2018   Scoliosis 12/18/2018   Sleep apnea 12/18/2018   Esophageal spasm 03/06/2018   Paroxysmal A-fib (HCC) 08/08/2017   Hiatal hernia 01/09/2017   Night sweats 03/09/2016   Recurrent UTI 10/21/2015   Left sided abdominal pain 10/21/2015   Atrophic vaginitis 10/21/2015   History of recurrent UTIs 04/08/2015   Multinodular goiter 04/03/2015   Thyroid  nodule 04/03/2015   Gallstones 06/16/2014   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   Chronic cystitis 06/04/2012   Lymphoma (HCC) 12/09/2011    Orientation RESPIRATION BLADDER Height & Weight     Self, Time, Situation, Place  Normal Continent Weight: 135 lb (61.2 kg) Height:  5' (152.4 cm)  BEHAVIORAL SYMPTOMS/MOOD NEUROLOGICAL BOWEL NUTRITION STATUS      Continent Diet (Regular)  AMBULATORY STATUS COMMUNICATION OF NEEDS Skin   Limited Assist Verbally Surgical wounds  (Surgical closed incision, Righ Hip)                       Personal Care Assistance Level of Assistance  Feeding, Bathing, Dressing Bathing Assistance: Limited assistance Feeding assistance: Independent Dressing Assistance: Limited assistance     Functional Limitations Info  Sight Sight Info: Impaired        SPECIAL CARE FACTORS FREQUENCY  PT (By licensed PT), OT (By licensed OT)     PT Frequency: 5x/week OT Frequency: 5x/week            Contractures      Additional Factors Info  Code Status, Allergies Code Status Info: Full Allergies Info: Hydrocodone           Current Medications (04/04/2024):  This is the current hospital active medication list Current Facility-Administered Medications  Medication Dose Route Frequency Provider Last Rate Last Admin   0.9 %  sodium chloride  infusion   Intravenous Continuous Hooten, Lynwood SHAUNNA, MD 100 mL/hr at 04/03/24 2211 Infusion Verify at 04/03/24 2211   acetaminophen  (TYLENOL ) tablet 325-650 mg  325-650 mg Oral Q6H PRN Hooten, Lynwood SHAUNNA, MD       alum & mag hydroxide-simeth (MAALOX/MYLANTA) 200-200-20 MG/5ML suspension 30 mL  30 mL Oral Q4H PRN Hooten, James P, MD   30 mL at 04/04/24 0905   amiodarone  (PACERONE ) tablet 100 mg  100 mg Oral Daily Hooten, James P, MD   100 mg at 04/04/24 9093   apixaban  (ELIQUIS ) tablet 5 mg  5 mg Oral BID Hooten, Lynwood SHAUNNA, MD  5 mg at 04/04/24 9094   bisacodyl  (DULCOLAX) suppository 10 mg  10 mg Rectal Daily PRN Hooten, Lynwood SQUIBB, MD       celecoxib  (CELEBREX ) capsule 200 mg  200 mg Oral BID Hooten, Lynwood SQUIBB, MD   200 mg at 04/04/24 9094   diltiazem  (CARDIZEM  CD) 24 hr capsule 240 mg  240 mg Oral Daily Hooten, Lynwood SQUIBB, MD       diphenhydrAMINE  (BENADRYL ) 12.5 MG/5ML elixir 12.5-25 mg  12.5-25 mg Oral Q4H PRN Hooten, Lynwood SQUIBB, MD       ferrous sulfate  tablet 325 mg  325 mg Oral BID WC Hooten, James P, MD   325 mg at 04/04/24 0815   furosemide  (LASIX ) tablet 20 mg  20 mg Oral PRN Hooten, Lynwood SQUIBB, MD        gabapentin  (NEURONTIN ) capsule 200 mg  200 mg Oral QHS Hooten, Lynwood SQUIBB, MD       HYDROmorphone  (DILAUDID ) injection 0.5-1 mg  0.5-1 mg Intravenous Q4H PRN Hooten, Lynwood SQUIBB, MD       magnesium  hydroxide (MILK OF MAGNESIA) suspension 30 mL  30 mL Oral Daily Hooten, Lynwood SQUIBB, MD       menthol  (CEPACOL) lozenge 3 mg  1 lozenge Oral PRN Hooten, Lynwood SQUIBB, MD       Or   phenol (CHLORASEPTIC) mouth spray 1 spray  1 spray Mouth/Throat PRN Hooten, Lynwood SQUIBB, MD       methenamine  (MANDELAMINE) tablet 1 g  1 g Oral QID Hooten, Lynwood SQUIBB, MD   1 g at 04/03/24 2208   metoCLOPramide  (REGLAN ) tablet 10 mg  10 mg Oral TID AC & HS Hooten, James P, MD   10 mg at 04/04/24 0815   mirtazapine  (REMERON ) tablet 15 mg  15 mg Oral QHS Hooten, James P, MD   15 mg at 04/03/24 2208   ondansetron  (ZOFRAN ) tablet 4 mg  4 mg Oral Q6H PRN Hooten, James P, MD   4 mg at 04/03/24 2208   Or   ondansetron  (ZOFRAN ) injection 4 mg  4 mg Intravenous Q6H PRN Hooten, Lynwood SQUIBB, MD       oxyCODONE  (Oxy IR/ROXICODONE ) immediate release tablet 10 mg  10 mg Oral Q4H PRN Hooten, Lynwood SQUIBB, MD       oxyCODONE  (Oxy IR/ROXICODONE ) immediate release tablet 5 mg  5 mg Oral Q4H PRN Hooten, Lynwood SQUIBB, MD       pantoprazole  (PROTONIX ) EC tablet 40 mg  40 mg Oral BID Hooten, James P, MD   40 mg at 04/04/24 9094   potassium chloride  (KLOR-CON ) CR tablet 10 mEq  10 mEq Oral PRN Hooten, Lynwood SQUIBB, MD       rosuvastatin  (CRESTOR ) tablet 5 mg  5 mg Oral QHS Hooten, Lynwood SQUIBB, MD   5 mg at 04/03/24 2208   senna-docusate (Senokot-S) tablet 1 tablet  1 tablet Oral BID Hooten, James P, MD   1 tablet at 04/04/24 9094   sodium phosphate  (FLEET) enema 1 enema  1 enema Rectal Once PRN Hooten, Lynwood SQUIBB, MD       traMADol  (ULTRAM ) tablet 50-100 mg  50-100 mg Oral Q4H PRN Hooten, James P, MD   50 mg at 04/03/24 2207     Discharge Medications: Please see discharge summary for a list of discharge medications.  Relevant Imaging Results:  Relevant Lab Results:   Additional  Information SSN: [id number]  Tamyrah Burbage  [place], LCSW

## 2024-04-04 NOTE — Progress Notes (Signed)
 Physical Therapy Treatment Patient Details Name: Natalie Torres MRN: [id number] DOB: May 28, 1941 Today's Date: 04/04/2024   History of Present Illness Natalie Torres is a 83 y.o. female who with worsening R hip pain for the last 6 months. Pt is POD 0 for R THA on day of evaluation.    PT Comments  Pt was long sitting in bed upon arrival. She does endorse more stiffness than earlier session however pt remains cooperative and motivated. Pt did require physical assistance to exit bed while adhering to posterior hip precautions. Symptoms of lightheadedness upon sitting up however BP stable and symptoms resolved after ~ 1 minute. Session progressed to standing to youth RW and ambulating 60 ft with slow antalgic gait. Vcing for posture correction and improved heel strike to toe off. DC recs remain appropriate to maximize independence and safety with all ADLs. Acute PT will continue to follow and progress per current POC.     If plan is discharge home, recommend the following: A little help with walking and/or transfers;A little help with bathing/dressing/bathroom;Assistance with cooking/housework     Equipment Recommendations  Rolling walker (2 wheels);BSC/3in1 (youth RW)       Precautions / Restrictions Precautions Precautions: Fall;Posterior Hip Precaution Booklet Issued: Yes (comment) Recall of Precautions/Restrictions: Intact Restrictions Weight Bearing Restrictions Per Provider Order: Yes RLE Weight Bearing Per Provider Order: Weight bearing as tolerated     Mobility  Bed Mobility Overal bed mobility: Needs Assistance Bed Mobility: Supine to Sit  Supine to sit: Min assist, Mod assist, Used rails  General bed mobility comments: BP 133/ 67 sympotoms of dizziness taht resolved with time.    Transfers Overall transfer level: Needs assistance Equipment used: Rolling walker (2 wheels) Transfers: Sit to/from Stand Sit to Stand: Contact guard assist, Min assist  General transfer  comment: CGA to stand from slightly elevated surfaces and from [facility] placed over toilet. pt still required vcing for correct handplacement and reminders for posterior precautions    Ambulation/Gait Ambulation/Gait assistance: Contact guard assist, Min assist Gait Distance (Feet): 60 Feet Assistive device: Rolling walker (2 wheels) Gait Pattern/deviations: Step-through pattern, Trunk flexed, Antalgic, Decreased stance time - right, Decreased step length - left Gait velocity: decreased  General Gait Details: Pt ambulated 60 ft with RW. slow antalgic gait however no LOB or safety concerns. distance limited by fatigue. does continue to require vcs for posture correction and staying inside RW. more antalgic gait this afternoon 2/2 to increased soreness pain    Balance Overall balance assessment: Needs assistance Sitting-balance support: Feet supported Sitting balance-Leahy Scale: Good     Standing balance support: During functional activity, Single extremity supported Standing balance-Leahy Scale: Fair Standing balance comment: pt was able to stand at sink and brush her teeth with mostly +1 UE support       Communication Communication Communication: No apparent difficulties  Cognition Arousal: Alert Behavior During Therapy: WFL for tasks assessed/performed   PT - Cognitive impairments: No apparent impairments    PT - Cognition Comments: pt is A and O. cooperative and pleasant Following commands: Intact Following commands impaired: Follows one step commands with increased time    Cueing Cueing Techniques: Verbal cues, Tactile cues  Exercises Total Joint Exercises Ankle Circles/Pumps: AROM, 10 reps Quad Sets: AROM, 10 reps Gluteal Sets: 10 reps Heel Slides: AROM, 10 reps Hip ABduction/ADduction: AAROM, 10 reps    General Comments General comments (skin integrity, edema, etc.): revired use of spirometer and need for continued performance fo HEP exercises  Pertinent  Vitals/Pain Pain Assessment Pain Assessment: 0-10 Pain Score: 4  Pain Location: hip Pain Descriptors / Indicators: Discomfort, Sore    Home Living Family/patient expects to be discharged to:: Other (Comment) (Lives at [facility] IL[facility]) Living Arrangements: Spouse/significant other Available Help at Discharge: Family Type of Home:  (Lives at [facility]) Home Access: Level entry       Home Layout: One level Home Equipment: Rollator (4 wheels)          PT Goals (current goals can now be found in the care plan section) Acute Rehab PT Goals Patient Stated Goal: to go home Progress towards PT goals: Progressing toward goals    Frequency    BID       AM-PAC PT 6 Clicks Mobility   Outcome Measure  Help needed turning from your back to your side while in a flat bed without using bedrails?: A Little Help needed moving from lying on your back to sitting on the side of a flat bed without using bedrails?: A Little Help needed moving to and from a bed to a chair (including a wheelchair)?: A Little Help needed standing up from a chair using your arms (e.g., wheelchair or bedside chair)?: A Little Help needed to walk in hospital room?: A Little Help needed climbing 3-5 steps with a railing? : A Little 6 Click Score: 18    End of Session   Activity Tolerance: Patient tolerated treatment well Patient left: in chair;with call bell/phone within reach;with chair alarm set Nurse Communication: Mobility status PT Visit Diagnosis: Unsteadiness on feet (R26.81);Muscle weakness (generalized) (M62.81)     Time: 8675-8642 PT Time Calculation (min) (ACUTE ONLY): 33 min  Charges:    $Gait Training: 8-22 mins $Therapeutic Activity: 8-22 mins PT General Charges $$ ACUTE PT VISIT: 1 Visit                     Rankin Essex PTA 04/04/24, 2:25 PM

## 2024-04-05 DIAGNOSIS — M1611 Unilateral primary osteoarthritis, right hip: Secondary | ICD-10-CM | POA: Diagnosis not present

## 2024-04-05 MED ORDER — TRAMADOL HCL 50 MG PO TABS: 50.0000 mg | ORAL_TABLET | ORAL | 0 refills | Status: AC | PRN

## 2024-04-05 MED ORDER — CHLORHEXIDINE GLUCONATE 4 % EX SOLN: 1.0000 | CUTANEOUS | 1 refills | Status: DC

## 2024-04-05 MED ORDER — OXYCODONE HCL 5 MG PO TABS: 5.0000 mg | ORAL_TABLET | ORAL | 0 refills | Status: DC | PRN

## 2024-04-05 MED ORDER — MUPIROCIN 2 % EX OINT: 1.0000 | TOPICAL_OINTMENT | Freq: Two times a day (BID) | CUTANEOUS | 0 refills | Status: DC

## 2024-04-05 MED ORDER — CELECOXIB 100 MG PO CAPS: 100.0000 mg | ORAL_CAPSULE | Freq: Two times a day (BID) | ORAL | 0 refills | Status: DC

## 2024-04-05 MED ORDER — CELECOXIB 200 MG PO CAPS: 200.0000 mg | ORAL_CAPSULE | Freq: Two times a day (BID) | ORAL | 1 refills | Status: DC

## 2024-04-05 NOTE — Plan of Care (Signed)
   Problem: Education: Goal: Knowledge of General Education information will improve Description Including pain rating scale, medication(s)/side effects and non-pharmacologic comfort measures Outcome: Progressing

## 2024-04-05 NOTE — Progress Notes (Signed)
   Subjective: 2 Days Post-Op Procedure(s) (LRB): ARTHROPLASTY, HIP, TOTAL,POSTERIOR APPROACH (Right) Patient reports pain as mild.   Patient is well, and has had no acute complaints or problems Denies any CP, SOB, ABD pain. We will continue therapy today.  Plan is to go Home after hospital stay.  Objective: Vital signs in last 24 hours: Temp:  [97.3 F (36.3 C)-98.3 F (36.8 C)] 98 F (36.7 C) (09/19 0724) Pulse Rate:  [60-73] 63 (09/19 0724) Resp:  [15-20] 20 (09/19 0724) BP: (104-148)/(55-77) 141/72 (09/19 0724) SpO2:  [97 %-98 %] 98 % (09/19 0724)  Intake/Output from previous day: No intake/output data recorded. Intake/Output this shift: No intake/output data recorded.  No results for input(s): HGB in the last 72 hours. No results for input(s): WBC, RBC, HCT, PLT in the last 72 hours. No results for input(s): NA, K, CL, CO2, BUN, CREATININE, GLUCOSE, CALCIUM  in the last 72 hours. No results for input(s): LABPT, INR in the last 72 hours.  EXAM General - Patient is Alert, Appropriate, and Oriented Extremity - Neurovascular intact Sensation intact distally Intact pulses distally Dorsiflexion/Plantar flexion intact No cellulitis present Compartment soft Dressing - dressing C/D/I and no drainage Motor Function - intact, moving foot and toes well on exam.   Past Medical History:  Diagnosis Date   Anxiety    Arthritis    Cancer (HCC)    lymphoma   Complication of anesthesia    slow to wake/very nauseous after pelvic surgery   GERD (gastroesophageal reflux disease)    History of hiatal hernia    HLD (hyperlipidemia)    HOH (hard of hearing)    aids   Insomnia    Mechanical back pain    Neurocardiogenic syncope    Osteoarthritis    Osteopenia    PONV (postoperative nausea and vomiting)    Postmenopausal status    Recurrent UTI    Scoliosis    Sleep apnea    Sleep apnea     Assessment/Plan:   2 Days Post-Op Procedure(s)  (LRB): ARTHROPLASTY, HIP, TOTAL,POSTERIOR APPROACH (Right) Principal Problem:   Hx of total hip arthroplasty, right  Estimated body mass index is 26.37 kg/m as calculated from the following:   Height as of this encounter: 5' (1.524 m).   Weight as of this encounter: 61.2 kg. Advance diet Up with therapy Pain well controlled VSS CM to assist with discharge to [facility] today   DVT Prophylaxis - TED hose and SCDs Eliquis  Weight-Bearing as tolerated to right leg   T. Medford Amber, PA-C [facility] 04/05/2024, 9:20 AM

## 2024-04-05 NOTE — Progress Notes (Signed)
 Discharge order received from Surgical team. Patient has no complaint. VSS. No signs of acute distress. Report given to  [facility] , Questions answered. No other issues. Patient was transported  by family to rehab.

## 2024-04-05 NOTE — TOC Transition Note (Addendum)
 Transition of Care [facility]) - Discharge Note   Patient Details  Name: Natalie Torres MRN: [id number] Date of Birth: June 18, 1941  [facility]) CM/SW Contact:  Alvaro Louder, LCSW Phone Number: 04/05/2024, 10:28 AM   Clinical Narrative:   11:59 AM: Patient indicated that she wanted her husband to take her to the facility.    LCSWA received insurance approval for patient to admit to SNF [facility]. LCSWA confirmed with MD that patient is stable for discharge. LCSWA notified the patient and they are in agreement with discharge. LCSWA confirmed bed is available at SNF. Transport arranged with [facility] for next available.  Number to call report: [phone], RM [phone]   TOC signing off   Final next level of care: Skilled Nursing Facility Barriers to Discharge: No Barriers Identified   Patient Goals and CMS Choice            Discharge Placement              Patient chooses bed at: [facility] Patient to be transferred to facility by: [facility] Name of family member notified: Self Patient and family notified of of transfer: 04/05/24  Discharge Plan and Services Additional resources added to the After Visit Summary for                                       Social Drivers of Health (SDOH) Interventions SDOH Screenings   Food Insecurity: No Food Insecurity (04/03/2024)  Housing: Low Risk  (04/03/2024)  Transportation Needs: No Transportation Needs (04/03/2024)  Utilities: Not At Risk (04/03/2024)  Financial Resource Strain: Low Risk  (02/13/2024)   Received from [facility]  Social Connections: Socially Integrated (04/03/2024)  Tobacco Use: Medium Risk (04/03/2024)     Readmission Risk Interventions     No data to display

## 2024-04-05 NOTE — Progress Notes (Signed)
 Physical Therapy Treatment Patient Details Name: Natalie Torres MRN: [id number] DOB: 10-25-1940 Today's Date: 04/05/2024   History of Present Illness Natalie Torres is a 83 y.o. female who with worsening R hip pain for the last 6 months. Pt is POD 0 for R THA on day of evaluation.    PT Comments  Pt was seated in recliner upon arrival. She endorses needing to use restroom. CGA for safety to stand and ambulate to/from BR. She successfully urinated on BSC (placed over toilet) prior to ambulation back to recliner. Distance limited by breakfast tray arrival. Pt was re-educated on importance of routine mobility and need for continued performance of HEP. Acute PT will continue to follow per current POC. DC recs remain appropriate to maximize independence and safety with all ADLs.    If plan is discharge home, recommend the following: A little help with walking and/or transfers;A little help with bathing/dressing/bathroom;Assistance with cooking/housework     Equipment Recommendations  Rolling walker (2 wheels);BSC/3in1 (youth RW)       Precautions / Restrictions Precautions Precautions: Fall;Posterior Hip Precaution Booklet Issued: Yes (comment) Recall of Precautions/Restrictions: Intact Restrictions Weight Bearing Restrictions Per Provider Order: Yes RLE Weight Bearing Per Provider Order: Weight bearing as tolerated     Mobility  Bed Mobility    General bed mobility comments: Pt was seated in recliner requesting to use BR upon arrival.    Transfers Overall transfer level: Needs assistance Equipment used: Rolling walker (2 wheels) Transfers: Sit to/from Stand Sit to Stand: Contact guard assist, Min assist  General transfer comment: CGA for safety with vcs for technique to adhere to posterior hip precautions. no physical lifting assistance required.    Ambulation/Gait Ambulation/Gait assistance: Contact guard assist, Min assist Gait Distance (Feet): 12 Feet Assistive device:  Rolling walker (2 wheels) Gait Pattern/deviations: Step-through pattern, Trunk flexed, Antalgic, Decreased stance time - right, Decreased step length - left Gait velocity: decreased  General Gait Details: Pt ambulated 2 x 12 ft to/from BR. distance limited by breakfast tray arrival. no LOB however pt encouraged to stay inside RW and attempt heel strke to toe off sequencing.     Balance Overall balance assessment: Needs assistance Sitting-balance support: Feet supported Sitting balance-Leahy Scale: Good     Standing balance support: During functional activity, Single extremity supported Standing balance-Leahy Scale: Fair Standing balance comment: pt was able to stand at sink and brush her teeth with mostly +1 UE support      Communication Communication Communication: No apparent difficulties  Cognition Arousal: Alert Behavior During Therapy: WFL for tasks assessed/performed   PT - Cognitive impairments: No apparent impairments      PT - Cognition Comments: pt is A and O. cooperative and pleasant Following commands: Intact Following commands impaired: Follows one step commands with increased time    Cueing Cueing Techniques: Verbal cues, Tactile cues         Pertinent Vitals/Pain Pain Assessment Pain Location: hip Pain Descriptors / Indicators: Discomfort, Sore     PT Goals (current goals can now be found in the care plan section) Acute Rehab PT Goals Patient Stated Goal: to go home    Frequency    BID       AM-PAC PT 6 Clicks Mobility   Outcome Measure  Help needed turning from your back to your side while in a flat bed without using bedrails?: A Little Help needed moving from lying on your back to sitting on the side of a flat bed without  using bedrails?: A Little Help needed moving to and from a bed to a chair (including a wheelchair)?: A Little Help needed standing up from a chair using your arms (e.g., wheelchair or bedside chair)?: A Little Help needed  to walk in hospital room?: A Little Help needed climbing 3-5 steps with a railing? : A Little 6 Click Score: 18    End of Session   Activity Tolerance: Patient tolerated treatment well Patient left: in chair;with call bell/phone within reach;with chair alarm set Nurse Communication: Mobility status PT Visit Diagnosis: Unsteadiness on feet (R26.81);Muscle weakness (generalized) (M62.81)     Time: 9078-9065 PT Time Calculation (min) (ACUTE ONLY): 13 min  Charges:    $Therapeutic Activity: 8-22 mins PT General Charges $$ ACUTE PT VISIT: 1 Visit                    Rankin Essex PTA 04/05/24, 11:43 AM

## 2024-04-05 NOTE — Discharge Summary (Signed)
 Physician Discharge Summary  Patient ID: Natalie Torres MRN: [id number] DOB/AGE: [birth year] 83 y.o.  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]   Discharge Diagnoses: Patient Active Problem List   Diagnosis Date Noted   Hx of total hip arthroplasty, right 04/03/2024   Prediabetes 04/04/2022   Persistent atrial fibrillation (HCC) 09/29/2021   Hearing loss 12/18/2018   Hyperlipidemia 12/18/2018   Insomnia 12/18/2018   Mechanical back pain 12/18/2018   Neurocardiogenic syncope 12/18/2018   Osteoarthritis 12/18/2018   Osteopenia 12/18/2018   Postmenopausal status 12/18/2018   Scoliosis 12/18/2018   Sleep apnea 12/18/2018   Esophageal spasm 03/06/2018   Paroxysmal A-fib (HCC) 08/08/2017   Hiatal hernia 01/09/2017   Night sweats 03/09/2016   Recurrent UTI 10/21/2015   Left sided abdominal pain 10/21/2015   Atrophic vaginitis 10/21/2015   History of recurrent UTIs 04/08/2015   Multinodular goiter 04/03/2015   Thyroid  nodule 04/03/2015   Gallstones 06/16/2014   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   Chronic cystitis 06/04/2012   Lymphoma (HCC) 12/09/2011    Past Medical History:  Diagnosis Date   Anxiety    Arthritis    Cancer (HCC)    lymphoma   Complication of anesthesia    slow to wake/very nauseous after pelvic surgery   GERD (gastroesophageal reflux disease)    History of hiatal hernia    HLD (hyperlipidemia)    HOH (hard of hearing)    aids   Insomnia    Mechanical back pain    Neurocardiogenic syncope    Osteoarthritis    Osteopenia    PONV (postoperative nausea and vomiting)    Postmenopausal status    Recurrent UTI    Scoliosis    Sleep apnea    Sleep apnea      Transfusion: none   Consultants (if any):   Discharged Condition: Improved  Hospital Course: Natalie Torres is an 83 y.o. female who was admitted 04/03/2024 with a  diagnosis of Hx of total hip arthroplasty, right and went to the operating room on 04/03/2024 and underwent the above named procedures.    Surgeries: Procedure(s): ARTHROPLASTY, HIP, TOTAL,POSTERIOR APPROACH on 04/03/2024 Patient tolerated the surgery well. Taken to PACU where she was stabilized and then transferred to the orthopedic floor.  Started on Eliquis  TEDs and SCDs applied bilaterally. Heels elevated on bed. No evidence of DVT. Negative Homan.  On postop day 1 Hemovac removed. Physical therapy started on day #1 for gait training and transfer. OT started day #1 for ADL and assisted devices.  Patient's IV was d/c on day #1. Patient was able to safely and independently complete all PT goals. PT recommending discharge to home at [facility]  On post op day #1 patient was stable and ready for discharge to [facility].  Implants:  DePuy size 5 high offset Actis femoral stem, 52 mm OD Pinnacle 100 acetabular component, +4 mm 10 degree Pinnacle Marathon polyethylene insert, and a 36 mm M-SPEC +1.5 mm hip ball    She was given perioperative antibiotics:  Anti-infectives (From admission, onward)    Start     Dose/Rate Route Frequency Ordered Stop   04/03/24 2200  methenamine  (MANDELAMINE) tablet 1 g        1 g Oral 4 times daily 04/03/24 1237     04/03/24 1400  ceFAZolin  (ANCEF ) IVPB 2g/100 mL premix        2 g  200 mL/hr over 30 Minutes Intravenous Every 6 hours 04/03/24 1127 04/03/24 2052   04/03/24 0600  ceFAZolin  (ANCEF ) IVPB 2g/100 mL premix        2 g 200 mL/hr over 30 Minutes Intravenous On call to O.R. 04/03/24 9442 04/03/24 0803     .  She was given sequential compression devices, early ambulation, and Eliquis , teds for DVT prophylaxis.  She benefited maximally from the hospital stay and there were no complications.    Recent vital signs:  Vitals:   04/05/24 0600 04/05/24 0724  BP: (!) 148/77 (!) 141/72  Pulse: 73 63  Resp: 16 20  Temp: 97.8 F (36.6 C) 98 F (36.7 C)   SpO2: 97% 98%    Recent laboratory studies:  Lab Results  Component Value Date   HGB 12.5 03/26/2024   HGB 13.4 09/27/2022   HGB 13.7 07/13/2022   Lab Results  Component Value Date   WBC 10.4 03/26/2024   PLT 229 03/26/2024   Lab Results  Component Value Date   INR 1.3 (H) 08/28/2021   Lab Results  Component Value Date   NA 141 03/26/2024   K 3.7 03/26/2024   CL 105 03/26/2024   CO2 26 03/26/2024   BUN 22 03/26/2024   CREATININE 1.01 (H) 03/26/2024   GLUCOSE 103 (H) 03/26/2024    Discharge Medications:   Allergies as of 04/05/2024       Reactions   Hydrocodone Swelling   Agitated with minimal pain relief, Hyperactivity, Not effective for pain, Agitated with minimal pain relief        Medication List     TAKE these medications    acetaminophen  500 MG tablet Commonly known as: TYLENOL  Take 500 mg by mouth at bedtime.   amiodarone  200 MG tablet Commonly known as: PACERONE  Take 0.5 tablets (100 mg total) by mouth daily.   apixaban  5 MG Tabs tablet Commonly known as: Eliquis  Take 1 tablet (5 mg total) by mouth 2 (two) times daily.   celecoxib  100 MG capsule Commonly known as: CELEBREX  Take 1 capsule (100 mg total) by mouth 2 (two) times daily for 14 days.   chlorhexidine  4 % external liquid Commonly known as: HIBICLENS  Apply 15 mLs (1 Application total) topically as directed for 30 doses. Use as directed daily for 5 days every other week for 6 weeks.   Cholecalciferol 125 MCG (5000 UT) Tabs Take 5,000 Units by mouth daily.   Culturelle Caps Take 1 capsule by mouth daily.   diltiazem  240 MG 24 hr capsule Commonly known as: CARDIZEM  CD TAKE 1 CAPSULE BY MOUTH ONCE DAILY   diphenhydramine -acetaminophen  25-500 MG Tabs tablet Commonly known as: TYLENOL  PM Take 1 tablet by mouth at bedtime.   Estradiol  10 MCG Tabs vaginal tablet Place 10 mcg vaginally every 14 (fourteen) days.   furosemide  20 MG tablet Commonly known as: LASIX  Take 1 tablet  (20 mg total) by mouth as needed. What changed:  when to take this reasons to take this additional instructions   gabapentin  100 MG capsule Commonly known as: NEURONTIN  Take 200 mg by mouth at bedtime.   methenamine  1 g tablet Commonly known as: HIPREX Take 1 g by mouth 2 (two) times daily with a meal.   mirtazapine  15 MG tablet Commonly known as: REMERON  Take 15 mg by mouth at bedtime.   mupirocin  ointment 2 % Commonly known as: BACTROBAN  Place 1 Application into the nose 2 (two) times daily for 60 doses. Use as directed 2  times daily for 5 days every other week for 6 weeks.   omeprazole 40 MG capsule Commonly known as: PRILOSEC Take 40 mg by mouth in the morning and at bedtime.   oxyCODONE  5 MG immediate release tablet Commonly known as: Oxy IR/ROXICODONE  Take 1 tablet (5 mg total) by mouth every 4 (four) hours as needed for moderate pain (pain score 4-6) (pain score 4-6).   potassium chloride  10 MEQ tablet Commonly known as: KLOR-CON  Take 1 tablet (10 mEq total) by mouth as needed (With the Lasix  when needed for swelling).   rosuvastatin  5 MG tablet Commonly known as: CRESTOR  TAKE 1 TABLET BY MOUTH NIGHTLY   traMADol  50 MG tablet Commonly known as: ULTRAM  Take 1-2 tablets (50-100 mg total) by mouth every 4 (four) hours as needed for moderate pain (pain score 4-6). What changed:  how much to take when to take this reasons to take this               Durable Medical Equipment  (From admission, onward)           Start     Ordered   04/03/24 1128  DME Walker rolling  Once       Question:  Patient needs a walker to treat with the following condition  Answer:  S/P total hip arthroplasty   04/03/24 1127   04/03/24 1128  DME Bedside commode  Once       Comments: Patient is not able to walk the distance required to go the bathroom, or he/she is unable to safely negotiate stairs required to access the bathroom.  A 3in1 BSC will alleviate this problem   Question:  Patient needs a bedside commode to treat with the following condition  Answer:  S/P total hip arthroplasty   04/03/24 1127            Diagnostic Studies: DG HIP UNILAT W OR W/O PELVIS 2-3 VIEWS RIGHT Result Date: 04/03/2024 CLINICAL DATA:  Status post right total hip arthroplasty. EXAM: DG HIP (WITH OR WITHOUT PELVIS) 2-3V RIGHT COMPARISON:  None Available. FINDINGS: Right femoral and acetabular components are well situated. Expected postoperative changes seen involving the surrounding soft tissues. IMPRESSION: Status post right total hip arthroplasty. Electronically Signed   By: Lynwood Landy Raddle M.D.   On: 04/03/2024 11:20    Disposition: Discharge disposition: 01-Home or Self Care          Contact information for follow-up providers     Hooten, Lynwood SQUIBB, MD Follow up on 05/16/2024.   Specialty: Orthopedic Surgery Why: at 3:00pm Contact information: [address] [facility] [place] [address] [phone]              Contact information for after-discharge care     Destination     [facility] .   Service: Skilled Nursing Contact information: [address] [place]  [phone]                      Signed: Debby JAYSON Amber 04/05/2024, 9:28 AM

## 2024-04-08 ENCOUNTER — Encounter: Payer: Self-pay | Admitting: Adult Health

## 2024-04-08 ENCOUNTER — Non-Acute Institutional Stay (SKILLED_NURSING_FACILITY): Payer: Self-pay | Admitting: Adult Health

## 2024-04-08 DIAGNOSIS — F5101 Primary insomnia: Secondary | ICD-10-CM | POA: Diagnosis not present

## 2024-04-08 DIAGNOSIS — I480 Paroxysmal atrial fibrillation: Secondary | ICD-10-CM | POA: Diagnosis not present

## 2024-04-08 DIAGNOSIS — K219 Gastro-esophageal reflux disease without esophagitis: Secondary | ICD-10-CM

## 2024-04-08 DIAGNOSIS — M1611 Unilateral primary osteoarthritis, right hip: Secondary | ICD-10-CM

## 2024-04-08 DIAGNOSIS — G629 Polyneuropathy, unspecified: Secondary | ICD-10-CM

## 2024-04-08 DIAGNOSIS — Z96641 Presence of right artificial hip joint: Secondary | ICD-10-CM | POA: Diagnosis not present

## 2024-04-08 LAB — CBC AND DIFFERENTIAL
HCT: 34 — AB (ref 36–46)
Hemoglobin: 10.6 — AB (ref 12.0–16.0)
Neutrophils Absolute: 4900
Platelets: 261 K/uL (ref 150–400)
WBC: 7

## 2024-04-08 LAB — CBC: RBC: 3.64 — AB (ref 3.87–5.11)

## 2024-04-08 LAB — BASIC METABOLIC PANEL WITH GFR
BUN: 15 (ref 4–21)
CO2: 29 — AB (ref 13–22)
Chloride: 103 (ref 99–108)
Creatinine: 0.9 (ref 0.5–1.1)
Glucose: 84
Potassium: 4.4 meq/L (ref 3.5–5.1)
Sodium: 138 (ref 137–147)

## 2024-04-08 LAB — COMPREHENSIVE METABOLIC PANEL WITH GFR
Calcium: 8.4 — AB (ref 8.7–10.7)
eGFR: 65

## 2024-04-08 NOTE — Progress Notes (Unsigned)
 Location:  Other [facility].  Nursing Home Room Number: [facility] 119A Place of Service:  SNF (31) Provider:  Medina-Vargas, Melford Tullier, DNP, FNP-BC  Patient Care Team: Auston Reyes BIRCH, MD as PCP - General (Internal Medicine)  Extended Emergency Contact Information Primary Emergency Contact: Jacques Lynwood BROCKS Address: [address]          [place], [place] [address] [place] Home Phone: [phone] Mobile Phone: [phone] Relation: Spouse Secondary Emergency Contact: Rolinda Almarie RAMAN Address: [address]          [place], [place] [address] [place] Home Phone: [phone] Mobile Phone: [phone] Relation: Daughter  Code Status:  Full Code  Goals of care: Advanced Directive information    04/03/2024    6:15 AM  Advanced Directives  Does Patient Have a Medical Advance Directive? Yes  Type of Estate agent of [place];Living will  Does patient want to make changes to medical advance directive? No - Patient declined  Copy of Healthcare Power of Attorney in Chart? No - copy requested     Chief Complaint  Patient presents with   Hospitalization Follow-up    Hospital Follow up    HPI:  Pt is a 83 y.o. female seen today for medical management of chronic diseases.     Past Medical History:  Diagnosis Date   Anxiety    Arthritis    Cancer (HCC)    lymphoma   Complication of anesthesia    slow to wake/very nauseous after pelvic surgery   GERD (gastroesophageal reflux disease)    History of hiatal hernia    HLD (hyperlipidemia)    HOH (hard of hearing)    aids   Insomnia    Mechanical back pain    Neurocardiogenic syncope    Osteoarthritis    Osteopenia    PONV (postoperative nausea and vomiting)    Postmenopausal status    Recurrent UTI    Scoliosis    Sleep apnea    Sleep apnea    Past Surgical History:  Procedure Laterality Date   ABDOMINAL HYSTERECTOMY     BLADDER SUSPENSION      CARDIOVERSION N/A 10/06/2022   Procedure: CARDIOVERSION;  Surgeon: Perla Evalene PARAS, MD;  Location: [facility] ORS;  Service: Cardiovascular;  Laterality: N/A;   CATARACT EXTRACTION W/PHACO Right 12/02/2014   Procedure: CATARACT EXTRACTION PHACO AND INTRAOCULAR LENS PLACEMENT (IOC);  Surgeon: Elsie Carmine, MD;  Location: [facility] ORS;  Service: Ophthalmology;  Laterality: Right;  US   00:38 AP% 41.5 CDE 7.22   CATARACT EXTRACTION W/PHACO Left 12/16/2014   Procedure: CATARACT EXTRACTION PHACO AND INTRAOCULAR LENS PLACEMENT (IOC);  Surgeon: Elsie Carmine, MD;  Location: [facility] ORS;  Service: Ophthalmology;  Laterality: Left;  US  00:41 AP% 21.4 CDE 8.74   CHOLECYSTECTOMY     EYE SURGERY     CATARACT   HIATAL HERNIA REPAIR     TONSILLECTOMY     TOTAL HIP ARTHROPLASTY Right 04/03/2024   Procedure: ARTHROPLASTY, HIP, TOTAL,POSTERIOR APPROACH;  Surgeon: Mardee Lynwood SQUIBB, MD;  Location: [facility] ORS;  Service: Orthopedics;  Laterality: Right;    Allergies  Allergen Reactions   Hydrocodone Swelling    Agitated with minimal pain relief, Hyperactivity, Not effective for pain, Agitated with minimal pain relief    Outpatient Encounter Medications as of 04/08/2024  Medication Sig   acetaminophen  (TYLENOL ) 500 MG tablet Take 500 mg by mouth at bedtime.   amiodarone  (PACERONE ) 200 MG tablet Take 0.5 tablets (100  mg total) by mouth daily.   apixaban  (ELIQUIS ) 5 MG TABS tablet Take 1 tablet (5 mg total) by mouth 2 (two) times daily.   celecoxib  (CELEBREX ) 100 MG capsule Take 1 capsule (100 mg total) by mouth 2 (two) times daily for 14 days.   Cholecalciferol 125 MCG (5000 UT) TABS Take 5,000 Units by mouth daily.   diltiazem  (CARDIZEM  CD) 240 MG 24 hr capsule TAKE 1 CAPSULE BY MOUTH ONCE DAILY   diphenhydramine -acetaminophen  (TYLENOL  PM) 25-500 MG TABS tablet Take 1 tablet by mouth at bedtime.   Estradiol  10 MCG TABS vaginal tablet Place 10 mcg vaginally every 14 (fourteen) days.   furosemide  (LASIX ) 20 MG  tablet Take 1 tablet (20 mg total) by mouth as needed.   gabapentin  (NEURONTIN ) 100 MG capsule Take 200 mg by mouth at bedtime.   Lactobacillus Rhamnosus, GG, (CULTURELLE) CAPS Take 1 capsule by mouth daily.   methenamine  (HIPREX) 1 g tablet Take 1 g by mouth 2 (two) times daily with a meal.   mirtazapine  (REMERON ) 15 MG tablet Take 15 mg by mouth at bedtime.   mupirocin  ointment (BACTROBAN ) 2 % Place 1 Application into the nose 2 (two) times daily for 60 doses. Use as directed 2 times daily for 5 days every other week for 6 weeks.   omeprazole (PRILOSEC) 40 MG capsule Take 40 mg by mouth in the morning and at bedtime.   oxyCODONE  (OXY IR/ROXICODONE ) 5 MG immediate release tablet Take 1 tablet (5 mg total) by mouth every 4 (four) hours as needed for moderate pain (pain score 4-6) (pain score 4-6).   potassium chloride  (KLOR-CON ) 10 MEQ tablet Take 1 tablet (10 mEq total) by mouth as needed (With the Lasix  when needed for swelling).   rosuvastatin  (CRESTOR ) 5 MG tablet TAKE 1 TABLET BY MOUTH NIGHTLY   traMADol  (ULTRAM ) 50 MG tablet Take 1-2 tablets (50-100 mg total) by mouth every 4 (four) hours as needed for moderate pain (pain score 4-6).   chlorhexidine  (HIBICLENS ) 4 % external liquid Apply 15 mLs (1 Application total) topically as directed for 30 doses. Use as directed daily for 5 days every other week for 6 weeks. (Patient not taking: Reported on 04/08/2024)   [DISCONTINUED] diphenhydramine -acetaminophen  (TYLENOL  PM) 25-500 MG TABS tablet Take 1 tablet by mouth at bedtime.   No facility-administered encounter medications on file as of 04/08/2024.    Review of Systems ***    Immunization History  Administered Date(s) Administered   INFLUENZA, HIGH DOSE SEASONAL PF 04/01/2017   Influenza Inj Mdck Quad Pf 04/05/2019, 03/29/2021, 04/04/2022   Influenza Split 04/08/2015   Influenza-Unspecified 04/04/2012, 04/13/2016, 04/10/2018   Moderna Sars-Covid-2 Vaccination 07/31/2019, 08/28/2019,  05/29/2020   PNEUMOCOCCAL CONJUGATE-20 09/11/2023   Unspecified SARS-COV-2 Vaccination 04/06/2024   Zoster Recombinant(Shingrix) 02/24/2020, 05/04/2020   Pertinent  Health Maintenance Due  Topic Date Due   DEXA SCAN  Never done   Influenza Vaccine  02/16/2024      09/04/2019    7:06 PM 05/08/2021   11:23 AM 08/28/2021   10:31 AM 07/13/2022   11:37 AM  Fall Risk  (RETIRED) Patient Fall Risk Level Low fall risk  High fall risk  Low fall risk  Low fall risk      Data saved with a previous flowsheet row definition     Vitals:   04/08/24 1034 04/08/24 1045  BP: (!) 140/71 (!) 145/75  Pulse: 68   Resp: 17   Temp: (!) 96.8 F (36 C)   SpO2: 98%  Weight: 137 lb 3.2 oz (62.2 kg)   Height: 5' (1.524 m)    Body mass index is 26.8 kg/m.  Physical Exam     Labs reviewed: Recent Labs    03/26/24 1511  NA 141  K 3.7  CL 105  CO2 26  GLUCOSE 103*  BUN 22  CREATININE 1.01*  CALCIUM  8.9   Recent Labs    03/26/24 1511  AST 17  ALT 15  ALKPHOS 75  BILITOT 0.5  PROT 6.4*  ALBUMIN 3.7   Recent Labs    03/26/24 1511  WBC 10.4  HGB 12.5  HCT 39.7  MCV 92.3  PLT 229   Lab Results  Component Value Date   TSH 1.838 11/30/2022   No results found for: HGBA1C No results found for: CHOL, HDL, LDLCALC, LDLDIRECT, TRIG, CHOLHDL  Significant Diagnostic Results in last 30 days:  DG HIP UNILAT W OR W/O PELVIS 2-3 VIEWS RIGHT Result Date: 04/03/2024 CLINICAL DATA:  Status post right total hip arthroplasty. EXAM: DG HIP (WITH OR WITHOUT PELVIS) 2-3V RIGHT COMPARISON:  None Available. FINDINGS: Right femoral and acetabular components are well situated. Expected postoperative changes seen involving the surrounding soft tissues. IMPRESSION: Status post right total hip arthroplasty. Electronically Signed   By: Lynwood Landy Raddle M.D.   On: 04/03/2024 11:20    Assessment/Plan ***   Family/ staff Communication: Discussed plan of care with resident and charge  nurse  Labs/tests ordered:     Natalie Lannan Medina-Vargas, DNP, MSN, FNP-BC [facility] [phone] (Monday-Friday 8:00 a.m. - 5:00 p.m.) [phone] (after hours)

## 2024-04-10 ENCOUNTER — Encounter: Payer: Self-pay | Admitting: Internal Medicine

## 2024-04-10 ENCOUNTER — Non-Acute Institutional Stay (SKILLED_NURSING_FACILITY): Payer: Self-pay | Admitting: Internal Medicine

## 2024-04-10 DIAGNOSIS — I10 Essential (primary) hypertension: Secondary | ICD-10-CM | POA: Diagnosis not present

## 2024-04-10 DIAGNOSIS — R9431 Abnormal electrocardiogram [ECG] [EKG]: Secondary | ICD-10-CM

## 2024-04-10 DIAGNOSIS — G4730 Sleep apnea, unspecified: Secondary | ICD-10-CM

## 2024-04-10 DIAGNOSIS — Z96641 Presence of right artificial hip joint: Secondary | ICD-10-CM | POA: Diagnosis not present

## 2024-04-10 NOTE — Patient Instructions (Signed)
 See assessment and plan under each diagnosis in the problem list and acutely for this visit

## 2024-04-10 NOTE — Progress Notes (Unsigned)
 NURSING HOME LOCATION:  [place]   ROOM NUMBER:  119A  CODE STATUS: Full code  PCP: Reyes BIRCH. Sparks, MD  This is a comprehensive admission note to this SNFperformed on this date less than 30 days from date of admission. Included are preadmission medical/surgical history; reconciled medication list; family history; social history and comprehensive review of systems.  Corrections and additions to the records were documented. Comprehensive physical exam was also performed. Additionally a clinical summary was entered for each active diagnosis pertinent to this admission in the Problem List to enhance continuity of care.  HPI: She was hospitalized 9/17 - 04/05/2024 for elective total hip arthroplasty on the right for primary osteoarthritis.  Posterior approach total hip arthroplasty was completed 9/17 without complication.  Postop DVT prophylaxis included Eliquis , teds, and SCDs applied bilaterally.  Hemovac was removed postop day 1.   Preop EKG was performed 9/9 and revealed prolonged QT with a QTc of 456. Labs were performed 9/9 preop revealing a creatinine 1.01 with a GFR of 55 indicating CKD stage IIIa.  Mild hyperglycemia was present with a glucose of 103.  CBC was normal with H/H of 12.5/39.7.  Patient was able to safely independently complete all PT goals; discharge to [facility] for rehab was recommended.  Past medical and surgical history: Includes lymphoma history; GERD; dyslipidemia; neurocardiogenic syncope; sleep apnea; and history of recurrent UTIs. She does validate a history of lymphoma for which she is seen [facility].  She states this appeared as a cervical lymph node which was biopsied.  She has had no radiation or chemotherapy.  She has a diagnosis of obstructive sleep apnea but has never been treated for such.  Her husband states that she does not snore and he has not documented any apnea.  She is on amiodarone  for PAF.  The chart states  that she was cardioverted but that was never completed as she converted back to normal sinus rhythm without intervention.  He is on amiodarone  and her thyroid  function is monitored closely.  Surgeries and procedures include abdominal hysterectomy; bladder suspension; cardioversion; cholecystectomy.  Family history: reviewed, non contributory due to advanced age.  Social history: Occasional alcohol intake; former smoker.  She was formally a Scientist, clinical (histocompatibility and immunogenetics).  Her husband is her hired Passenger transport manager.   Review of systems: She states that she has some residual little-medium soreness at the [facility].  She states it is actually better when she mobilizes.  She describes occasional dysphagia.  She also has occasional reflux and has been on a PPI.  Recurrent UTIs are a chronic problem for which she is on prophylactic medications.  She describes occasional slight tingling but no peripheral numbness.  She relates this to scoliosis which has also been associated with back pain.  Constitutional: No fever, significant weight change, fatigue  Eyes: No redness, discharge, pain, vision change ENT/mouth: No nasal congestion, purulent discharge, earache, change in hearing, sore throat  Cardiovascular: No chest pain, palpitations, paroxysmal nocturnal dyspnea, claudication, edema  Respiratory: No cough, sputum production, hemoptysis, DOE, significant snoring, apnea Gastrointestinal: No heartburn, dysphagia, abdominal pain, nausea /vomiting, rectal bleeding, melena, change in bowels Genitourinary: No dysuria, hematuria, pyuria, incontinence, nocturia Musculoskeletal: No joint stiffness, joint swelling, weakness, pain Dermatologic: No rash, pruritus, change in appearance of skin Neurologic: No dizziness, headache, syncope, seizures, numbness, tingling Psychiatric: No significant anxiety, depression, insomnia, anorexia Endocrine: No change in hair/skin/nails, excessive thirst, excessive hunger, excessive  urination  Hematologic/lymphatic: No  significant bruising, lymphadenopathy, abnormal bleeding Allergy/immunology: No itchy/watery eyes, significant sneezing, urticaria, angioedema  Physical exam:  Pertinent or positive findings: She is alert and oriented and very intelligent.  She is well-groomed.  General hygiene is excellent.  She has a left very brief grade 1 systolic murmur.  Second heart sounds increased.  Abdomen is slightly protuberant.  She has 1/2+ edema at the sock line.  Dorsalis pedis pulses are stronger than posterior tibial pulses.  She has interosseous wasting of the hands. General appearance: Adequately nourished; no acute distress, increased work of breathing is present.   Lymphatic: No lymphadenopathy about the head, neck, axilla. Eyes: No conjunctival inflammation or lid edema is present. There is no scleral icterus. Ears:  External ear exam shows no significant lesions or deformities.   Nose:  External nasal examination shows no deformity or inflammation. Nasal mucosa are pink and moist without lesions, exudates Oral exam: Lips and gums are healthy appearing.There is no oropharyngeal erythema or exudate. Neck:  No thyromegaly, masses, tenderness noted.    Heart:  Normal rate and regular rhythm. S1 and S2 normal without gallop, murmur, click, rub.  Lungs: Chest clear to auscultation without wheezes, rhonchi, rales, rubs. Abdomen: Bowel sounds are normal.  Abdomen is soft and nontender with no organomegaly, hernias, masses. GU: Deferred  Extremities:  No cyanosis, clubbing, edema. Neurologic exam:  Strength equal  in upper & lower extremities. Balance, Rhomberg, finger to nose testing could not be completed due to clinical state Deep tendon reflexes are equal Skin: Warm & dry w/o tenting. No significant lesions or rash.  See clinical summary under each active problem in the Problem List with associated updated therapeutic plan:

## 2024-04-12 DIAGNOSIS — I10 Essential (primary) hypertension: Secondary | ICD-10-CM | POA: Insufficient documentation

## 2024-04-12 DIAGNOSIS — R9431 Abnormal electrocardiogram [ECG] [EKG]: Secondary | ICD-10-CM | POA: Insufficient documentation

## 2024-04-12 NOTE — Assessment & Plan Note (Signed)
 Husband reports her snoring, but he has not noted any apnea.

## 2024-04-12 NOTE — Assessment & Plan Note (Signed)
 Minimal residual pain. Progressing well in PT/OT. Ortho follow up as scheduled.

## 2024-04-12 NOTE — Assessment & Plan Note (Signed)
 On CCB. BP average should be determined & antihypertensive meds adjusted appropriately.

## 2024-04-12 NOTE — Assessment & Plan Note (Signed)
 Her PCP & Cardiologist will receive copy of this note. They can assess risk.

## 2024-04-16 ENCOUNTER — Encounter: Payer: Self-pay | Admitting: Nurse Practitioner

## 2024-04-16 ENCOUNTER — Non-Acute Institutional Stay (SKILLED_NURSING_FACILITY): Payer: Self-pay | Admitting: Nurse Practitioner

## 2024-04-16 DIAGNOSIS — I480 Paroxysmal atrial fibrillation: Secondary | ICD-10-CM | POA: Diagnosis not present

## 2024-04-16 DIAGNOSIS — G629 Polyneuropathy, unspecified: Secondary | ICD-10-CM | POA: Diagnosis not present

## 2024-04-16 DIAGNOSIS — I10 Essential (primary) hypertension: Secondary | ICD-10-CM

## 2024-04-16 DIAGNOSIS — Z96641 Presence of right artificial hip joint: Secondary | ICD-10-CM

## 2024-04-16 DIAGNOSIS — R600 Localized edema: Secondary | ICD-10-CM

## 2024-04-16 NOTE — Progress Notes (Unsigned)
 Location:  Other [facility].  Nursing Home Room Number: [facility] [id number] Place of Service:  SNF [phone]) Natalie An, NP  PCP: Auston Reyes BIRCH, MD  Patient Care Team: Auston Reyes BIRCH, MD as PCP - General (Internal Medicine)  Extended Emergency Contact Information Primary Emergency Contact: Jacques Lynwood BROCKS Address: [address]          [place], [place] [address] [place] Home Phone: [phone] Mobile Phone: [phone] Relation: Spouse Secondary Emergency Contact: Rolinda Almarie RAMAN Address: [address]          [place], [place] [address] [place] Home Phone: [phone] Mobile Phone: [phone] Relation: Daughter  Goals of care: Advanced Directive information    04/03/2024    6:15 AM  Advanced Directives  Does Patient Have a Medical Advance Directive? Yes  Type of Estate agent of [place];Living will  Does patient want to make changes to medical advance directive? No - Patient declined  Copy of Healthcare Power of Attorney in Chart? No - copy requested     Chief Complaint  Patient presents with   Discharge Note    Discharge.     HPI:  Pt is a 83 y.o. female seen today for Discharge. At [facility] after total hip on 9/17 She is ready for discharge home.  She plans to see ortho for 6 week follow up.  She still has her staples from orthopedic    Past Medical History:  Diagnosis Date   Anxiety    Arthritis    Cancer (HCC)    lymphoma   Complication of anesthesia    slow to wake/very nauseous after pelvic surgery   GERD (gastroesophageal reflux disease)    History of hiatal hernia    HLD (hyperlipidemia)    HOH (hard of hearing)    aids   Insomnia    Mechanical back pain    Neurocardiogenic syncope    Osteoarthritis    Osteopenia    PONV (postoperative nausea and vomiting)    Postmenopausal status    Recurrent UTI    Scoliosis    Sleep apnea    Sleep apnea    Past Surgical History:   Procedure Laterality Date   ABDOMINAL HYSTERECTOMY     BLADDER SUSPENSION     CARDIOVERSION N/A 10/06/2022   Procedure: CARDIOVERSION;  Surgeon: Perla Evalene PARAS, MD;  Location: [facility] ORS;  Service: Cardiovascular;  Laterality: N/A;   CATARACT EXTRACTION W/PHACO Right 12/02/2014   Procedure: CATARACT EXTRACTION PHACO AND INTRAOCULAR LENS PLACEMENT (IOC);  Surgeon: Elsie Carmine, MD;  Location: [facility] ORS;  Service: Ophthalmology;  Laterality: Right;  US   00:38 AP% 41.5 CDE 7.22   CATARACT EXTRACTION W/PHACO Left 12/16/2014   Procedure: CATARACT EXTRACTION PHACO AND INTRAOCULAR LENS PLACEMENT (IOC);  Surgeon: Elsie Carmine, MD;  Location: [facility] ORS;  Service: Ophthalmology;  Laterality: Left;  US  00:41 AP% 21.4 CDE 8.74   CHOLECYSTECTOMY     EYE SURGERY     CATARACT   HIATAL HERNIA REPAIR     TONSILLECTOMY     TOTAL HIP ARTHROPLASTY Right 04/03/2024   Procedure: ARTHROPLASTY, HIP, TOTAL,POSTERIOR APPROACH;  Surgeon: Mardee Lynwood SQUIBB, MD;  Location: [facility] ORS;  Service: Orthopedics;  Laterality: Right;    Allergies  Allergen Reactions   Hydrocodone Swelling    Agitated with minimal pain relief, Hyperactivity, Not effective for pain, Agitated with minimal pain relief    Outpatient Encounter Medications as of 04/16/2024  Medication Sig   acetaminophen  (  TYLENOL ) 500 MG tablet Take 500 mg by mouth at bedtime.   amiodarone  (PACERONE ) 200 MG tablet Take 0.5 tablets (100 mg total) by mouth daily.   apixaban  (ELIQUIS ) 5 MG TABS tablet Take 1 tablet (5 mg total) by mouth 2 (two) times daily.   Cholecalciferol 125 MCG (5000 UT) TABS Take 5,000 Units by mouth daily.   diltiazem  (CARDIZEM  CD) 240 MG 24 hr capsule TAKE 1 CAPSULE BY MOUTH ONCE DAILY   diphenhydramine -acetaminophen  (TYLENOL  PM) 25-500 MG TABS tablet Take 1 tablet by mouth at bedtime.   Estradiol  10 MCG TABS vaginal tablet Place 10 mcg vaginally every 14 (fourteen) days.   furosemide  (LASIX ) 20 MG tablet Take 1 tablet (20 mg  total) by mouth as needed.   gabapentin  (NEURONTIN ) 100 MG capsule Take 200 mg by mouth at bedtime.   Lactobacillus Rhamnosus, GG, (CULTURELLE) CAPS Take 1 capsule by mouth daily.   methenamine  (HIPREX) 1 g tablet Take 1 g by mouth 2 (two) times daily with a meal.   mirtazapine  (REMERON ) 15 MG tablet Take 15 mg by mouth at bedtime.   omeprazole (PRILOSEC) 40 MG capsule Take 40 mg by mouth in the morning and at bedtime.   potassium chloride  (KLOR-CON ) 10 MEQ tablet Take 1 tablet (10 mEq total) by mouth as needed (With the Lasix  when needed for swelling).   rosuvastatin  (CRESTOR ) 5 MG tablet TAKE 1 TABLET BY MOUTH NIGHTLY   traMADol  (ULTRAM ) 50 MG tablet Take 1-2 tablets (50-100 mg total) by mouth every 4 (four) hours as needed for moderate pain (pain score 4-6).   celecoxib  (CELEBREX ) 100 MG capsule Take 1 capsule (100 mg total) by mouth 2 (two) times daily for 14 days. (Patient not taking: Reported on 04/16/2024)   chlorhexidine  (HIBICLENS ) 4 % external liquid Apply 15 mLs (1 Application total) topically as directed for 30 doses. Use as directed daily for 5 days every other week for 6 weeks. (Patient not taking: Reported on 04/16/2024)   mupirocin  ointment (BACTROBAN ) 2 % Place 1 Application into the nose 2 (two) times daily for 60 doses. Use as directed 2 times daily for 5 days every other week for 6 weeks. (Patient not taking: Reported on 04/16/2024)   oxyCODONE  (OXY IR/ROXICODONE ) 5 MG immediate release tablet Take 1 tablet (5 mg total) by mouth every 4 (four) hours as needed for moderate pain (pain score 4-6) (pain score 4-6). (Patient not taking: Reported on 04/16/2024)   No facility-administered encounter medications on file as of 04/16/2024.    Review of Systems ***  Immunization History  Administered Date(s) Administered   INFLUENZA, HIGH DOSE SEASONAL PF 04/01/2017   Influenza Inj Mdck Quad Pf 04/05/2019, 03/29/2021, 04/04/2022   Influenza Split 04/08/2015   Influenza-Unspecified  04/04/2012, 04/13/2016, 04/10/2018   Moderna Sars-Covid-2 Vaccination 07/31/2019, 08/28/2019, 05/29/2020   PNEUMOCOCCAL CONJUGATE-20 09/11/2023   RSV,unspecified 04/09/2024   Unspecified SARS-COV-2 Vaccination 04/28/2023, 04/06/2024   Zoster Recombinant(Shingrix) 02/24/2020, 05/04/2020   Pertinent  Health Maintenance Due  Topic Date Due   DEXA SCAN  Never done   Influenza Vaccine  02/16/2024      09/04/2019    7:06 PM 05/08/2021   11:23 AM 08/28/2021   10:31 AM 07/13/2022   11:37 AM  Fall Risk  (RETIRED) Patient Fall Risk Level Low fall risk  High fall risk  Low fall risk  Low fall risk      Data saved with a previous flowsheet row definition   Functional Status Survey:    Vitals:  04/16/24 0826  BP: 132/73  Pulse: 64  Resp: 18  Temp: (!) 97.3 F (36.3 C)  SpO2: 98%  Weight: 137 lb 6.4 oz (62.3 kg)  Height: 5' (1.524 m)   Body mass index is 26.83 kg/m. Physical Exam***  Labs reviewed: Recent Labs    03/26/24 1511 04/08/24 0000  NA 141 138  K 3.7 4.4  CL 105 103  CO2 26 29*  GLUCOSE 103*  --   BUN 22 15  CREATININE 1.01* 0.9  CALCIUM  8.9 8.4*   Recent Labs    03/26/24 1511  AST 17  ALT 15  ALKPHOS 75  BILITOT 0.5  PROT 6.4*  ALBUMIN 3.7   Recent Labs    03/26/24 1511 04/08/24 0000  WBC 10.4 7.0  NEUTROABS  --  4,900.00  HGB 12.5 10.6*  HCT 39.7 34*  MCV 92.3  --   PLT 229 261   Lab Results  Component Value Date   TSH 1.838 11/30/2022   No results found for: HGBA1C No results found for: CHOL, HDL, LDLCALC, LDLDIRECT, TRIG, CHOLHDL  Significant Diagnostic Results in last 30 days:  DG HIP UNILAT W OR W/O PELVIS 2-3 VIEWS RIGHT Result Date: 04/03/2024 CLINICAL DATA:  Status post right total hip arthroplasty. EXAM: DG HIP (WITH OR WITHOUT PELVIS) 2-3V RIGHT COMPARISON:  None Available. FINDINGS: Right femoral and acetabular components are well situated. Expected postoperative changes seen involving the surrounding soft  tissues. IMPRESSION: Status post right total hip arthroplasty. Electronically Signed   By: Lynwood Landy Raddle M.D.   On: 04/03/2024 11:20    Assessment/Plan No problem-specific Assessment & Plan notes found for this encounter.     Paetyn Pietrzak K. Caro BODILY [facility] [phone]

## 2024-04-18 ENCOUNTER — Other Ambulatory Visit: Payer: Self-pay

## 2024-05-24 ENCOUNTER — Other Ambulatory Visit: Payer: Self-pay | Admitting: Cardiovascular Disease

## 2024-05-29 ENCOUNTER — Ambulatory Visit
Admission: RE | Admit: 2024-05-29 | Discharge: 2024-05-29 | Disposition: A | Discharge status: Home / Self Care | Source: Ambulatory Visit | Attending: Physician Assistant | Admitting: Physician Assistant

## 2024-05-29 ENCOUNTER — Other Ambulatory Visit: Payer: Self-pay | Admitting: Physician Assistant

## 2024-05-29 DIAGNOSIS — M7989 Other specified soft tissue disorders: Secondary | ICD-10-CM | POA: Insufficient documentation

## 2024-06-24 ENCOUNTER — Other Ambulatory Visit: Payer: Self-pay | Admitting: Internal Medicine

## 2024-06-24 DIAGNOSIS — Z1231 Encounter for screening mammogram for malignant neoplasm of breast: Secondary | ICD-10-CM

## 2024-07-04 ENCOUNTER — Other Ambulatory Visit: Payer: Self-pay | Admitting: Cardiology

## 2024-07-16 ENCOUNTER — Ambulatory Visit
Admission: RE | Admit: 2024-07-16 | Discharge: 2024-07-16 | Disposition: A | Discharge status: Home / Self Care | Source: Ambulatory Visit | Attending: Internal Medicine | Admitting: Internal Medicine

## 2024-07-16 DIAGNOSIS — Z1231 Encounter for screening mammogram for malignant neoplasm of breast: Secondary | ICD-10-CM | POA: Diagnosis present

## 2024-07-29 ENCOUNTER — Ambulatory Visit: Attending: Cardiology | Admitting: Cardiology

## 2024-07-29 ENCOUNTER — Encounter: Payer: Self-pay | Admitting: Cardiology

## 2024-07-29 VITALS — BP 116/72 | HR 94 | Ht 60.0 in | Wt 139.0 lb

## 2024-07-29 DIAGNOSIS — I480 Paroxysmal atrial fibrillation: Secondary | ICD-10-CM | POA: Diagnosis not present

## 2024-07-29 DIAGNOSIS — R600 Localized edema: Secondary | ICD-10-CM | POA: Diagnosis not present

## 2024-07-29 DIAGNOSIS — R7303 Prediabetes: Secondary | ICD-10-CM

## 2024-07-29 DIAGNOSIS — E782 Mixed hyperlipidemia: Secondary | ICD-10-CM

## 2024-07-29 MED ORDER — AMIODARONE HCL 200 MG PO TABS: 200.0000 mg | ORAL_TABLET | Freq: Every day | ORAL | 11 refills | Status: AC

## 2024-07-29 MED ORDER — METOPROLOL SUCCINATE ER 50 MG PO TB24: 50.0000 mg | ORAL_TABLET | Freq: Every day | ORAL | 3 refills | Status: AC

## 2024-07-29 MED ORDER — ROSUVASTATIN CALCIUM 5 MG PO TABS: 5.0000 mg | ORAL_TABLET | Freq: Every day | ORAL | 3 refills | Status: AC

## 2024-07-29 NOTE — Patient Instructions (Signed)
 Amb referral to EP  Medication Instructions:  Your physician recommends the following medication changes.  STOP TAKING: Diltiazem   INCREASE: Toprol  XL to 50 mg Amiodarone  to 200 mg Daily   *If you need a refill on your cardiac medications before your next appointment, please call your pharmacy*  Lab Work: No labs ordered today  If you have labs (blood work) drawn today and your tests are completely normal, you will receive your results only by: MyChart Message (if you have MyChart) OR A paper copy in the mail If you have any lab test that is abnormal or we need to change your treatment, we will call you to review the results.  Testing/Procedures: No test ordered today   Follow-Up: At [facility], you and your health needs are our priority.  As part of our continuing mission to provide you with exceptional heart care, our providers are all part of one team.  This team includes your primary Cardiologist (physician) and Advanced Practice Providers or APPs (Physician Assistants and Nurse Practitioners) who all work together to provide you with the care you need, when you need it.  Your next appointment:   6 week(s)  Provider:   Tylene Lunch, NP

## 2024-07-29 NOTE — Progress Notes (Unsigned)
 " Cardiology Office Note   Date:  07/30/2024  ID:  TAMIYAH MOULIN, DOB [birth year], MRN [id number] PCP: Perla Evalene PARAS, MD  [facility] Providers Cardiologist:  None     History of Present Illness ZIGGY REVELES is a 84 y.o. female with past medical history of paroxysmal atrial fibrillation, mixed hyperlipidemia, neurocardiogenic syncope, follicular lymphoma, sleep apnea, presents today for follow-up.   Echocardiogram completed 11/22 demonstrated LVEF greater than 55%, mild MR and TR. Continued on amiodarone  and apixaban  and she was scheduled for an elective cardioversion. She presented to her appointment for scheduled cardioversion EKG was performed she was noted to be in sinus. Cardioversion was canceled and she was scheduled for follow-up.   She was seen in clinic September 2024, overall she was feeling well.  She denies any anginal anginal equivalents.  She had had 1 spell of palpitations with some occasional swelling to her bilateral lower extremities.  She had been evaluated in urgent care status post mechanical fall and had some swelling in her ankle since that time.  Amiodarone  was weaned down as the last time she stated she was weaned totally off of amiodarone  she did come back in atrial fibrillation.  She was last seen in clinic 01/15/2024 overall doing well from a cardiac perspective.  She has been taking her medications without any adverse side effects.  She denied any bleeding with no blood noted in her urine or stool.  EKG revealed sinus rhythm with PACs.  There were no medication changes that were made or further testing that was ordered at that time.   She was hospitalized at [facility] 9/17 - 04/05/2024 for primary osteoarthritis of the right hip and a total hip arthroplasty.  She had surgery completed on 9/17.  She was restarted on her apixaban .  Physical therapy started on day 1 for gait training and transfer.  She was considered stable on postop day 1 and was ready for discharge  to [facility].  She returns to clinic today accompanied by her husband.  She states that overall she has been doing fairly well.  She continues to have an increased amount of swelling to her bilateral lower extremities and is concerned it is likely related to a medication that she has on it.  She does also continue to have ongoing fatigue.  She states that she has worsened when she is in atrial fibrillation.  Her symptoms are often exacerbated when she is in A-fib.  She has exertional dyspnea as well.  Denies any chest discomfort.  She is overall fatigued.  She states that she has been compliant with her current medication regimen.  She has not missed any of her apixaban .  Denies any bleeding with no blood noted in her stool or urine.  Recently followed up with her primary care provider who suggested she may benefit from flecainide.  ROS: 10 point review of system has been reviewed and considered negative the exception was been listed in the HPI  Studies Reviewed EKG Interpretation Date/Time:  Monday July 29 2024 14:00:00 EST Ventricular Rate:  94 PR Interval:    QRS Duration:  80 QT Interval:  354 QTC Calculation: 442 R Axis:   61  Text Interpretation: Atrial fibrillation Nonspecific T wave abnormality When compared with ECG of 26-Mar-2024 15:24, Confirmed by Gerard Frederick ([id number]) on 07/29/2024 2:05:58 PM    TTE 05/26/21 from outside hospital INTERPRETATION  NORMAL LEFT VENTRICULAR SYSTOLIC FUNCTION  NORMAL RIGHT VENTRICULAR SYSTOLIC FUNCTION  NO VALVULAR STENOSIS  MILD MR, TR, PR  EF >55%  Closest EF: >55% (Estimated)  Mitral: MILD MR  Tricuspid: MILD TR   Risk Assessment/Calculations  CHA2DS2-VASc Score = 3   This indicates a 3.2% annual risk of stroke. The patient's score is based upon: CHF History: 0 HTN History: 0 Diabetes History: 0 Stroke History: 0 Vascular Disease History: 0 Age Score: 2 Gender Score: 1            Physical Exam VS:  BP 116/72 (BP Location:  Left Arm, Patient Position: Sitting, Cuff Size: Normal)   Pulse 94   Ht 5' (1.524 m)   Wt 139 lb (63 kg)   SpO2 99%   BMI 27.15 kg/m        Wt Readings from Last 3 Encounters:  07/29/24 139 lb (63 kg)  04/16/24 137 lb 6.4 oz (62.3 kg)  04/10/24 136 lb 9.6 oz (62 kg)    GEN: Well nourished, well developed in no acute distress NECK: No JVD; No carotid bruits CARDIAC: IR IR, no murmurs, rubs, gallops RESPIRATORY:  Clear to auscultation without rales, wheezing or rhonchi  ABDOMEN: Soft, non-tender, non-distended EXTREMITIES:  1-2+ pitting edema to BLE; No deformity   ASSESSMENT AND PLAN Paroxysmal atrial fibrillation where she is in atrial fibrillation today that is rate controlled at a rate of 94 on EKG.  Last time she was in atrial fibrillation per EKG review was in February 2024.  With her ongoing symptoms and fatigue amiodarone  is being increased back to 250 mg daily diltiazem  has been discontinued due to extensive swelling and she has been placed on Toprol  XL 50 mg daily due to significant baseline bradycardia.  She is also being sent for referral to EP to evaluate other antiarrhythmic options versus possible ablation.  She is continued on apixaban  5 mg twice daily for CHA2DS2-VASc score of at least 3 for stroke prophylaxis.  Mixed hyperlipidemia with an LDL of 29.  She is continued on rosuvastatin  5 mg daily.  Recently had labs done by her PCP.  Peripheral edema that is 1-2+ pitting edema to the lower extremities bilaterally.  She has continued on furosemide  20 mg daily as needed.  We are discontinuing her diltiazem  and starting home Toprol -XL in hopes of decreasing her increased amount of peripheral edema likely secondary to going back into fibrillation along with side effects of her diltiazem .  Prediabetes with a hemoglobin A1c of 6.2.  Ongoing management per PCP.       Dispo: Patient to return to clinic to see MD/APP in 6 weeks or sooner if needed for further  evaluation.  Signed, Belvia Gotschall, NP   "

## 2024-07-30 ENCOUNTER — Encounter: Payer: Self-pay | Admitting: Cardiology

## 2024-07-31 ENCOUNTER — Telehealth: Payer: Self-pay | Admitting: Cardiology

## 2024-07-31 NOTE — Telephone Encounter (Signed)
 Pt needs c/b to schedule EKG. Please advise.

## 2024-08-02 NOTE — Telephone Encounter (Signed)
 Left message on VM to schedule with EP

## 2024-09-10 ENCOUNTER — Ambulatory Visit: Admitting: Cardiology

## 2024-09-18 ENCOUNTER — Ambulatory Visit: Admitting: Cardiology

## 2024-11-12 ENCOUNTER — Ambulatory Visit: Admitting: Cardiology
